# Patient Record
Sex: Female | Born: 1945
Health system: Southern US, Community
[De-identification: ages and names within clinical notes are randomized; demographics above are authoritative.]

## PROBLEM LIST (undated history)

## (undated) DIAGNOSIS — F0150 Vascular dementia without behavioral disturbance: Secondary | ICD-10-CM

## (undated) DIAGNOSIS — F32A Depression, unspecified: Secondary | ICD-10-CM

## (undated) DIAGNOSIS — R5383 Other fatigue: Secondary | ICD-10-CM

## (undated) DIAGNOSIS — E669 Obesity, unspecified: Secondary | ICD-10-CM

## (undated) DIAGNOSIS — E785 Hyperlipidemia, unspecified: Secondary | ICD-10-CM

## (undated) DIAGNOSIS — I679 Cerebrovascular disease, unspecified: Secondary | ICD-10-CM

## (undated) DIAGNOSIS — Z6832 Body mass index (BMI) 32.0-32.9, adult: Secondary | ICD-10-CM

## (undated) DIAGNOSIS — Z13820 Encounter for screening for osteoporosis: Secondary | ICD-10-CM

## (undated) DIAGNOSIS — F0153 Vascular dementia, unspecified severity, with mood disturbance: Secondary | ICD-10-CM

## (undated) DIAGNOSIS — R5381 Other malaise: Secondary | ICD-10-CM

## (undated) DIAGNOSIS — I10 Essential (primary) hypertension: Secondary | ICD-10-CM

## (undated) DIAGNOSIS — G4700 Insomnia, unspecified: Secondary | ICD-10-CM

## (undated) DIAGNOSIS — E871 Hypo-osmolality and hyponatremia: Secondary | ICD-10-CM

## (undated) DIAGNOSIS — R569 Unspecified convulsions: Secondary | ICD-10-CM

## (undated) DIAGNOSIS — R7309 Other abnormal glucose: Secondary | ICD-10-CM

## (undated) HISTORY — DX: Vascular dementia without behavioral disturbance: F01.50

## (undated) HISTORY — DX: Depression, unspecified: F32.A

## (undated) HISTORY — DX: Cerebrovascular disease, unspecified: I67.9

## (undated) HISTORY — DX: Other abnormal glucose: R73.09

## (undated) HISTORY — DX: Hypo-osmolality and hyponatremia: E87.1

## (undated) HISTORY — DX: Vascular dementia, unspecified severity, with mood disturbance: F01.53

## (undated) HISTORY — DX: Other fatigue: R53.83

## (undated) HISTORY — DX: Unspecified convulsions: R56.9

## (undated) HISTORY — DX: Hyperlipidemia, unspecified: E78.5

## (undated) HISTORY — DX: Other malaise: R53.81

## (undated) HISTORY — DX: Obesity, unspecified: E66.9

## (undated) HISTORY — DX: Encounter for screening for osteoporosis: Z13.820

## (undated) HISTORY — DX: Body mass index (BMI) 32.0-32.9, adult: Z68.32

## (undated) HISTORY — PX: CATARACT EXTRACTION: SUR2

## (undated) HISTORY — DX: Essential (primary) hypertension: I10

## (undated) HISTORY — DX: Insomnia, unspecified: G47.00

---

## 1995-04-04 HISTORY — PX: ABDOMINAL HYSTERECTOMY: SHX81

## 2001-04-03 HISTORY — PX: GALLBLADDER SURGERY: SHX652

## 2012-04-03 HISTORY — PX: APPENDECTOMY: SHX54

## 2012-05-30 DIAGNOSIS — I10 Essential (primary) hypertension: Secondary | ICD-10-CM | POA: Diagnosis not present

## 2012-05-30 DIAGNOSIS — Z0000 Encounter for general adult medical examination without abnormal findings: Secondary | ICD-10-CM | POA: Diagnosis not present

## 2012-05-30 DIAGNOSIS — Z6834 Body mass index (BMI) 34.0-34.9, adult: Secondary | ICD-10-CM | POA: Diagnosis not present

## 2012-05-30 DIAGNOSIS — R5383 Other fatigue: Secondary | ICD-10-CM | POA: Diagnosis not present

## 2012-05-30 DIAGNOSIS — Z9181 History of falling: Secondary | ICD-10-CM | POA: Diagnosis not present

## 2012-05-30 DIAGNOSIS — Z23 Encounter for immunization: Secondary | ICD-10-CM | POA: Diagnosis not present

## 2012-05-30 DIAGNOSIS — E785 Hyperlipidemia, unspecified: Secondary | ICD-10-CM | POA: Diagnosis not present

## 2012-05-30 DIAGNOSIS — Z1331 Encounter for screening for depression: Secondary | ICD-10-CM | POA: Diagnosis not present

## 2012-06-21 DIAGNOSIS — Z13820 Encounter for screening for osteoporosis: Secondary | ICD-10-CM | POA: Diagnosis not present

## 2012-06-21 DIAGNOSIS — Z1231 Encounter for screening mammogram for malignant neoplasm of breast: Secondary | ICD-10-CM | POA: Diagnosis not present

## 2012-10-19 DIAGNOSIS — N3000 Acute cystitis without hematuria: Secondary | ICD-10-CM | POA: Diagnosis not present

## 2012-11-05 DIAGNOSIS — R1084 Generalized abdominal pain: Secondary | ICD-10-CM | POA: Diagnosis not present

## 2012-11-08 DIAGNOSIS — F3289 Other specified depressive episodes: Secondary | ICD-10-CM | POA: Diagnosis not present

## 2012-11-08 DIAGNOSIS — K352 Acute appendicitis with generalized peritonitis, without abscess: Secondary | ICD-10-CM | POA: Diagnosis not present

## 2012-11-08 DIAGNOSIS — K3580 Unspecified acute appendicitis: Secondary | ICD-10-CM | POA: Diagnosis not present

## 2012-11-08 DIAGNOSIS — K35209 Acute appendicitis with generalized peritonitis, without abscess, unspecified as to perforation: Secondary | ICD-10-CM | POA: Diagnosis not present

## 2012-11-08 DIAGNOSIS — Z90710 Acquired absence of both cervix and uterus: Secondary | ICD-10-CM | POA: Diagnosis not present

## 2012-11-08 DIAGNOSIS — I10 Essential (primary) hypertension: Secondary | ICD-10-CM | POA: Diagnosis not present

## 2012-11-08 DIAGNOSIS — F329 Major depressive disorder, single episode, unspecified: Secondary | ICD-10-CM | POA: Diagnosis not present

## 2012-11-08 DIAGNOSIS — K219 Gastro-esophageal reflux disease without esophagitis: Secondary | ICD-10-CM | POA: Diagnosis not present

## 2012-11-08 DIAGNOSIS — R110 Nausea: Secondary | ICD-10-CM | POA: Diagnosis not present

## 2012-11-08 DIAGNOSIS — E7800 Pure hypercholesterolemia, unspecified: Secondary | ICD-10-CM | POA: Diagnosis not present

## 2012-11-08 DIAGNOSIS — Z7982 Long term (current) use of aspirin: Secondary | ICD-10-CM | POA: Diagnosis not present

## 2012-11-08 DIAGNOSIS — Z79899 Other long term (current) drug therapy: Secondary | ICD-10-CM | POA: Diagnosis not present

## 2012-11-08 DIAGNOSIS — R1031 Right lower quadrant pain: Secondary | ICD-10-CM | POA: Diagnosis not present

## 2012-11-29 DIAGNOSIS — Z6833 Body mass index (BMI) 33.0-33.9, adult: Secondary | ICD-10-CM | POA: Diagnosis not present

## 2012-11-29 DIAGNOSIS — I10 Essential (primary) hypertension: Secondary | ICD-10-CM | POA: Diagnosis not present

## 2012-11-29 DIAGNOSIS — F329 Major depressive disorder, single episode, unspecified: Secondary | ICD-10-CM | POA: Diagnosis not present

## 2012-11-29 DIAGNOSIS — E785 Hyperlipidemia, unspecified: Secondary | ICD-10-CM | POA: Diagnosis not present

## 2013-01-02 DIAGNOSIS — Z23 Encounter for immunization: Secondary | ICD-10-CM | POA: Diagnosis not present

## 2013-04-30 DIAGNOSIS — H2510 Age-related nuclear cataract, unspecified eye: Secondary | ICD-10-CM | POA: Diagnosis not present

## 2013-06-05 DIAGNOSIS — Z79899 Other long term (current) drug therapy: Secondary | ICD-10-CM | POA: Diagnosis not present

## 2013-06-05 DIAGNOSIS — I10 Essential (primary) hypertension: Secondary | ICD-10-CM | POA: Diagnosis not present

## 2013-06-05 DIAGNOSIS — F329 Major depressive disorder, single episode, unspecified: Secondary | ICD-10-CM | POA: Diagnosis not present

## 2013-06-05 DIAGNOSIS — Z1331 Encounter for screening for depression: Secondary | ICD-10-CM | POA: Diagnosis not present

## 2013-06-05 DIAGNOSIS — F3289 Other specified depressive episodes: Secondary | ICD-10-CM | POA: Diagnosis not present

## 2013-06-05 DIAGNOSIS — Z6833 Body mass index (BMI) 33.0-33.9, adult: Secondary | ICD-10-CM | POA: Diagnosis not present

## 2013-06-05 DIAGNOSIS — E785 Hyperlipidemia, unspecified: Secondary | ICD-10-CM | POA: Diagnosis not present

## 2013-06-05 DIAGNOSIS — Z9181 History of falling: Secondary | ICD-10-CM | POA: Diagnosis not present

## 2013-06-24 DIAGNOSIS — Z1231 Encounter for screening mammogram for malignant neoplasm of breast: Secondary | ICD-10-CM | POA: Diagnosis not present

## 2013-09-05 DIAGNOSIS — Z6834 Body mass index (BMI) 34.0-34.9, adult: Secondary | ICD-10-CM | POA: Diagnosis not present

## 2013-09-05 DIAGNOSIS — J209 Acute bronchitis, unspecified: Secondary | ICD-10-CM | POA: Diagnosis not present

## 2013-11-14 DIAGNOSIS — H40039 Anatomical narrow angle, unspecified eye: Secondary | ICD-10-CM | POA: Diagnosis not present

## 2013-11-14 DIAGNOSIS — H5200 Hypermetropia, unspecified eye: Secondary | ICD-10-CM | POA: Diagnosis not present

## 2013-11-14 DIAGNOSIS — H2510 Age-related nuclear cataract, unspecified eye: Secondary | ICD-10-CM | POA: Diagnosis not present

## 2013-12-09 DIAGNOSIS — I10 Essential (primary) hypertension: Secondary | ICD-10-CM | POA: Diagnosis not present

## 2013-12-09 DIAGNOSIS — E785 Hyperlipidemia, unspecified: Secondary | ICD-10-CM | POA: Diagnosis not present

## 2013-12-09 DIAGNOSIS — F3289 Other specified depressive episodes: Secondary | ICD-10-CM | POA: Diagnosis not present

## 2013-12-09 DIAGNOSIS — F329 Major depressive disorder, single episode, unspecified: Secondary | ICD-10-CM | POA: Diagnosis not present

## 2013-12-09 DIAGNOSIS — R5383 Other fatigue: Secondary | ICD-10-CM | POA: Diagnosis not present

## 2013-12-09 DIAGNOSIS — Z23 Encounter for immunization: Secondary | ICD-10-CM | POA: Diagnosis not present

## 2013-12-09 DIAGNOSIS — R5381 Other malaise: Secondary | ICD-10-CM | POA: Diagnosis not present

## 2014-01-27 DIAGNOSIS — Z23 Encounter for immunization: Secondary | ICD-10-CM | POA: Diagnosis not present

## 2014-01-27 DIAGNOSIS — R5381 Other malaise: Secondary | ICD-10-CM | POA: Diagnosis not present

## 2014-01-27 DIAGNOSIS — Z6835 Body mass index (BMI) 35.0-35.9, adult: Secondary | ICD-10-CM | POA: Diagnosis not present

## 2014-02-24 DIAGNOSIS — N390 Urinary tract infection, site not specified: Secondary | ICD-10-CM | POA: Diagnosis not present

## 2014-02-24 DIAGNOSIS — R5381 Other malaise: Secondary | ICD-10-CM | POA: Diagnosis not present

## 2014-02-24 DIAGNOSIS — Z6835 Body mass index (BMI) 35.0-35.9, adult: Secondary | ICD-10-CM | POA: Diagnosis not present

## 2014-03-26 DIAGNOSIS — Z6835 Body mass index (BMI) 35.0-35.9, adult: Secondary | ICD-10-CM | POA: Diagnosis not present

## 2014-03-26 DIAGNOSIS — R300 Dysuria: Secondary | ICD-10-CM | POA: Diagnosis not present

## 2014-03-26 DIAGNOSIS — F329 Major depressive disorder, single episode, unspecified: Secondary | ICD-10-CM | POA: Diagnosis not present

## 2014-03-26 DIAGNOSIS — R5381 Other malaise: Secondary | ICD-10-CM | POA: Diagnosis not present

## 2014-06-01 DIAGNOSIS — Z6835 Body mass index (BMI) 35.0-35.9, adult: Secondary | ICD-10-CM | POA: Diagnosis not present

## 2014-06-01 DIAGNOSIS — Z1212 Encounter for screening for malignant neoplasm of rectum: Secondary | ICD-10-CM | POA: Diagnosis not present

## 2014-06-01 DIAGNOSIS — I10 Essential (primary) hypertension: Secondary | ICD-10-CM | POA: Diagnosis not present

## 2014-06-01 DIAGNOSIS — F329 Major depressive disorder, single episode, unspecified: Secondary | ICD-10-CM | POA: Diagnosis not present

## 2014-06-01 DIAGNOSIS — R5381 Other malaise: Secondary | ICD-10-CM | POA: Diagnosis not present

## 2014-06-01 DIAGNOSIS — E785 Hyperlipidemia, unspecified: Secondary | ICD-10-CM | POA: Diagnosis not present

## 2014-06-01 DIAGNOSIS — Z0000 Encounter for general adult medical examination without abnormal findings: Secondary | ICD-10-CM | POA: Diagnosis not present

## 2014-06-30 DIAGNOSIS — Z1231 Encounter for screening mammogram for malignant neoplasm of breast: Secondary | ICD-10-CM | POA: Diagnosis not present

## 2014-08-02 HISTORY — PX: OTHER SURGICAL HISTORY: SHX169

## 2014-08-07 DIAGNOSIS — H2513 Age-related nuclear cataract, bilateral: Secondary | ICD-10-CM | POA: Diagnosis not present

## 2014-08-07 DIAGNOSIS — H52203 Unspecified astigmatism, bilateral: Secondary | ICD-10-CM | POA: Diagnosis not present

## 2014-08-07 DIAGNOSIS — H1789 Other corneal scars and opacities: Secondary | ICD-10-CM | POA: Diagnosis not present

## 2014-08-24 DIAGNOSIS — H1789 Other corneal scars and opacities: Secondary | ICD-10-CM | POA: Diagnosis not present

## 2014-09-01 DIAGNOSIS — Z1389 Encounter for screening for other disorder: Secondary | ICD-10-CM | POA: Diagnosis not present

## 2014-09-01 DIAGNOSIS — E785 Hyperlipidemia, unspecified: Secondary | ICD-10-CM | POA: Diagnosis not present

## 2014-09-01 DIAGNOSIS — Z9181 History of falling: Secondary | ICD-10-CM | POA: Diagnosis not present

## 2014-09-01 DIAGNOSIS — F329 Major depressive disorder, single episode, unspecified: Secondary | ICD-10-CM | POA: Diagnosis not present

## 2014-09-01 DIAGNOSIS — I10 Essential (primary) hypertension: Secondary | ICD-10-CM | POA: Diagnosis not present

## 2014-09-01 DIAGNOSIS — Z6834 Body mass index (BMI) 34.0-34.9, adult: Secondary | ICD-10-CM | POA: Diagnosis not present

## 2014-09-21 DIAGNOSIS — Z6835 Body mass index (BMI) 35.0-35.9, adult: Secondary | ICD-10-CM | POA: Diagnosis not present

## 2014-09-21 DIAGNOSIS — F329 Major depressive disorder, single episode, unspecified: Secondary | ICD-10-CM | POA: Diagnosis not present

## 2014-09-21 DIAGNOSIS — Z9181 History of falling: Secondary | ICD-10-CM | POA: Diagnosis not present

## 2014-09-21 DIAGNOSIS — R5381 Other malaise: Secondary | ICD-10-CM | POA: Diagnosis not present

## 2014-09-21 DIAGNOSIS — Z1389 Encounter for screening for other disorder: Secondary | ICD-10-CM | POA: Diagnosis not present

## 2014-10-21 DIAGNOSIS — Z6835 Body mass index (BMI) 35.0-35.9, adult: Secondary | ICD-10-CM | POA: Diagnosis not present

## 2014-10-21 DIAGNOSIS — I10 Essential (primary) hypertension: Secondary | ICD-10-CM | POA: Diagnosis not present

## 2014-10-21 DIAGNOSIS — R5381 Other malaise: Secondary | ICD-10-CM | POA: Diagnosis not present

## 2014-10-21 DIAGNOSIS — F329 Major depressive disorder, single episode, unspecified: Secondary | ICD-10-CM | POA: Diagnosis not present

## 2014-10-29 DIAGNOSIS — H2511 Age-related nuclear cataract, right eye: Secondary | ICD-10-CM | POA: Diagnosis not present

## 2014-10-29 DIAGNOSIS — H25811 Combined forms of age-related cataract, right eye: Secondary | ICD-10-CM | POA: Diagnosis not present

## 2014-10-29 HISTORY — PX: CATARACT EXTRACTION: SUR2

## 2014-12-03 DIAGNOSIS — Z6834 Body mass index (BMI) 34.0-34.9, adult: Secondary | ICD-10-CM | POA: Diagnosis not present

## 2014-12-03 DIAGNOSIS — I10 Essential (primary) hypertension: Secondary | ICD-10-CM | POA: Diagnosis not present

## 2014-12-03 DIAGNOSIS — R5381 Other malaise: Secondary | ICD-10-CM | POA: Diagnosis not present

## 2014-12-03 DIAGNOSIS — F329 Major depressive disorder, single episode, unspecified: Secondary | ICD-10-CM | POA: Diagnosis not present

## 2014-12-03 DIAGNOSIS — E785 Hyperlipidemia, unspecified: Secondary | ICD-10-CM | POA: Diagnosis not present

## 2014-12-03 DIAGNOSIS — R7309 Other abnormal glucose: Secondary | ICD-10-CM | POA: Diagnosis not present

## 2015-03-11 DIAGNOSIS — Z23 Encounter for immunization: Secondary | ICD-10-CM | POA: Diagnosis not present

## 2015-03-11 DIAGNOSIS — E785 Hyperlipidemia, unspecified: Secondary | ICD-10-CM | POA: Diagnosis not present

## 2015-03-11 DIAGNOSIS — Z6834 Body mass index (BMI) 34.0-34.9, adult: Secondary | ICD-10-CM | POA: Diagnosis not present

## 2015-03-11 DIAGNOSIS — Z1389 Encounter for screening for other disorder: Secondary | ICD-10-CM | POA: Diagnosis not present

## 2015-03-11 DIAGNOSIS — F329 Major depressive disorder, single episode, unspecified: Secondary | ICD-10-CM | POA: Diagnosis not present

## 2015-03-11 DIAGNOSIS — I10 Essential (primary) hypertension: Secondary | ICD-10-CM | POA: Diagnosis not present

## 2015-04-07 DIAGNOSIS — E538 Deficiency of other specified B group vitamins: Secondary | ICD-10-CM | POA: Diagnosis not present

## 2015-04-07 DIAGNOSIS — E559 Vitamin D deficiency, unspecified: Secondary | ICD-10-CM | POA: Diagnosis not present

## 2015-06-15 DIAGNOSIS — Z6834 Body mass index (BMI) 34.0-34.9, adult: Secondary | ICD-10-CM | POA: Diagnosis not present

## 2015-06-15 DIAGNOSIS — E785 Hyperlipidemia, unspecified: Secondary | ICD-10-CM | POA: Diagnosis not present

## 2015-06-15 DIAGNOSIS — R5381 Other malaise: Secondary | ICD-10-CM | POA: Diagnosis not present

## 2015-06-15 DIAGNOSIS — F329 Major depressive disorder, single episode, unspecified: Secondary | ICD-10-CM | POA: Diagnosis not present

## 2015-06-15 DIAGNOSIS — Z1212 Encounter for screening for malignant neoplasm of rectum: Secondary | ICD-10-CM | POA: Diagnosis not present

## 2015-06-15 DIAGNOSIS — E669 Obesity, unspecified: Secondary | ICD-10-CM | POA: Diagnosis not present

## 2015-06-15 DIAGNOSIS — I10 Essential (primary) hypertension: Secondary | ICD-10-CM | POA: Diagnosis not present

## 2015-06-15 DIAGNOSIS — Z0000 Encounter for general adult medical examination without abnormal findings: Secondary | ICD-10-CM | POA: Diagnosis not present

## 2015-06-15 DIAGNOSIS — Z1231 Encounter for screening mammogram for malignant neoplasm of breast: Secondary | ICD-10-CM | POA: Diagnosis not present

## 2015-07-01 DIAGNOSIS — Z1231 Encounter for screening mammogram for malignant neoplasm of breast: Secondary | ICD-10-CM | POA: Diagnosis not present

## 2015-08-10 DIAGNOSIS — H1789 Other corneal scars and opacities: Secondary | ICD-10-CM | POA: Diagnosis not present

## 2015-08-10 DIAGNOSIS — H52203 Unspecified astigmatism, bilateral: Secondary | ICD-10-CM | POA: Diagnosis not present

## 2015-12-17 DIAGNOSIS — Z23 Encounter for immunization: Secondary | ICD-10-CM | POA: Diagnosis not present

## 2015-12-20 DIAGNOSIS — E669 Obesity, unspecified: Secondary | ICD-10-CM | POA: Diagnosis not present

## 2015-12-20 DIAGNOSIS — E785 Hyperlipidemia, unspecified: Secondary | ICD-10-CM | POA: Diagnosis not present

## 2015-12-20 DIAGNOSIS — F329 Major depressive disorder, single episode, unspecified: Secondary | ICD-10-CM | POA: Diagnosis not present

## 2015-12-20 DIAGNOSIS — H938X1 Other specified disorders of right ear: Secondary | ICD-10-CM | POA: Diagnosis not present

## 2015-12-20 DIAGNOSIS — I10 Essential (primary) hypertension: Secondary | ICD-10-CM | POA: Diagnosis not present

## 2015-12-20 DIAGNOSIS — Z6829 Body mass index (BMI) 29.0-29.9, adult: Secondary | ICD-10-CM | POA: Diagnosis not present

## 2015-12-20 DIAGNOSIS — R7309 Other abnormal glucose: Secondary | ICD-10-CM | POA: Diagnosis not present

## 2016-01-12 DIAGNOSIS — J029 Acute pharyngitis, unspecified: Secondary | ICD-10-CM | POA: Diagnosis not present

## 2016-01-12 DIAGNOSIS — I10 Essential (primary) hypertension: Secondary | ICD-10-CM | POA: Diagnosis not present

## 2016-05-08 DIAGNOSIS — J020 Streptococcal pharyngitis: Secondary | ICD-10-CM | POA: Diagnosis not present

## 2016-06-15 DIAGNOSIS — E785 Hyperlipidemia, unspecified: Secondary | ICD-10-CM | POA: Diagnosis not present

## 2016-06-15 DIAGNOSIS — Z9181 History of falling: Secondary | ICD-10-CM | POA: Diagnosis not present

## 2016-06-15 DIAGNOSIS — Z1389 Encounter for screening for other disorder: Secondary | ICD-10-CM | POA: Diagnosis not present

## 2016-06-15 DIAGNOSIS — Z0000 Encounter for general adult medical examination without abnormal findings: Secondary | ICD-10-CM | POA: Diagnosis not present

## 2016-06-15 DIAGNOSIS — Z136 Encounter for screening for cardiovascular disorders: Secondary | ICD-10-CM | POA: Diagnosis not present

## 2016-06-15 DIAGNOSIS — Z1231 Encounter for screening mammogram for malignant neoplasm of breast: Secondary | ICD-10-CM | POA: Diagnosis not present

## 2016-06-20 DIAGNOSIS — R7309 Other abnormal glucose: Secondary | ICD-10-CM | POA: Diagnosis not present

## 2016-06-20 DIAGNOSIS — E785 Hyperlipidemia, unspecified: Secondary | ICD-10-CM | POA: Diagnosis not present

## 2016-06-20 DIAGNOSIS — I10 Essential (primary) hypertension: Secondary | ICD-10-CM | POA: Diagnosis not present

## 2016-07-03 DIAGNOSIS — Z1231 Encounter for screening mammogram for malignant neoplasm of breast: Secondary | ICD-10-CM | POA: Diagnosis not present

## 2016-08-15 DIAGNOSIS — H52203 Unspecified astigmatism, bilateral: Secondary | ICD-10-CM | POA: Diagnosis not present

## 2016-08-15 DIAGNOSIS — H2512 Age-related nuclear cataract, left eye: Secondary | ICD-10-CM | POA: Diagnosis not present

## 2016-12-06 DIAGNOSIS — Z87440 Personal history of urinary (tract) infections: Secondary | ICD-10-CM | POA: Diagnosis not present

## 2016-12-06 DIAGNOSIS — E222 Syndrome of inappropriate secretion of antidiuretic hormone: Secondary | ICD-10-CM | POA: Diagnosis present

## 2016-12-06 DIAGNOSIS — N3000 Acute cystitis without hematuria: Secondary | ICD-10-CM | POA: Diagnosis not present

## 2016-12-06 DIAGNOSIS — M1990 Unspecified osteoarthritis, unspecified site: Secondary | ICD-10-CM | POA: Diagnosis present

## 2016-12-06 DIAGNOSIS — R05 Cough: Secondary | ICD-10-CM | POA: Diagnosis not present

## 2016-12-06 DIAGNOSIS — Z885 Allergy status to narcotic agent status: Secondary | ICD-10-CM | POA: Diagnosis not present

## 2016-12-06 DIAGNOSIS — E041 Nontoxic single thyroid nodule: Secondary | ICD-10-CM | POA: Diagnosis present

## 2016-12-06 DIAGNOSIS — R3129 Other microscopic hematuria: Secondary | ICD-10-CM | POA: Diagnosis not present

## 2016-12-06 DIAGNOSIS — E7800 Pure hypercholesterolemia, unspecified: Secondary | ICD-10-CM | POA: Diagnosis present

## 2016-12-06 DIAGNOSIS — R569 Unspecified convulsions: Secondary | ICD-10-CM | POA: Diagnosis not present

## 2016-12-06 DIAGNOSIS — Z79899 Other long term (current) drug therapy: Secondary | ICD-10-CM | POA: Diagnosis not present

## 2016-12-06 DIAGNOSIS — R930 Abnormal findings on diagnostic imaging of skull and head, not elsewhere classified: Secondary | ICD-10-CM | POA: Diagnosis not present

## 2016-12-06 DIAGNOSIS — I63233 Cerebral infarction due to unspecified occlusion or stenosis of bilateral carotid arteries: Secondary | ICD-10-CM | POA: Diagnosis not present

## 2016-12-06 DIAGNOSIS — Z23 Encounter for immunization: Secondary | ICD-10-CM | POA: Diagnosis not present

## 2016-12-06 DIAGNOSIS — N3001 Acute cystitis with hematuria: Secondary | ICD-10-CM | POA: Diagnosis not present

## 2016-12-06 DIAGNOSIS — F418 Other specified anxiety disorders: Secondary | ICD-10-CM | POA: Diagnosis present

## 2016-12-06 DIAGNOSIS — K219 Gastro-esophageal reflux disease without esophagitis: Secondary | ICD-10-CM | POA: Diagnosis present

## 2016-12-06 DIAGNOSIS — Z7982 Long term (current) use of aspirin: Secondary | ICD-10-CM | POA: Diagnosis not present

## 2016-12-06 DIAGNOSIS — Z87891 Personal history of nicotine dependence: Secondary | ICD-10-CM | POA: Diagnosis not present

## 2016-12-06 DIAGNOSIS — E871 Hypo-osmolality and hyponatremia: Secondary | ICD-10-CM | POA: Diagnosis not present

## 2016-12-06 DIAGNOSIS — I10 Essential (primary) hypertension: Secondary | ICD-10-CM | POA: Diagnosis present

## 2016-12-06 DIAGNOSIS — G4089 Other seizures: Secondary | ICD-10-CM | POA: Diagnosis not present

## 2016-12-06 DIAGNOSIS — I6789 Other cerebrovascular disease: Secondary | ICD-10-CM | POA: Diagnosis not present

## 2016-12-07 DIAGNOSIS — I6789 Other cerebrovascular disease: Secondary | ICD-10-CM

## 2016-12-12 DIAGNOSIS — R569 Unspecified convulsions: Secondary | ICD-10-CM | POA: Diagnosis not present

## 2016-12-12 DIAGNOSIS — E041 Nontoxic single thyroid nodule: Secondary | ICD-10-CM | POA: Diagnosis not present

## 2016-12-12 DIAGNOSIS — E871 Hypo-osmolality and hyponatremia: Secondary | ICD-10-CM | POA: Diagnosis not present

## 2016-12-12 DIAGNOSIS — N390 Urinary tract infection, site not specified: Secondary | ICD-10-CM | POA: Diagnosis not present

## 2016-12-12 DIAGNOSIS — R5382 Chronic fatigue, unspecified: Secondary | ICD-10-CM | POA: Diagnosis not present

## 2016-12-12 DIAGNOSIS — I639 Cerebral infarction, unspecified: Secondary | ICD-10-CM | POA: Diagnosis not present

## 2016-12-12 DIAGNOSIS — I6529 Occlusion and stenosis of unspecified carotid artery: Secondary | ICD-10-CM | POA: Diagnosis not present

## 2016-12-12 DIAGNOSIS — Z6828 Body mass index (BMI) 28.0-28.9, adult: Secondary | ICD-10-CM | POA: Diagnosis not present

## 2016-12-12 DIAGNOSIS — I10 Essential (primary) hypertension: Secondary | ICD-10-CM | POA: Diagnosis not present

## 2016-12-12 DIAGNOSIS — Z79899 Other long term (current) drug therapy: Secondary | ICD-10-CM | POA: Diagnosis not present

## 2016-12-12 DIAGNOSIS — F329 Major depressive disorder, single episode, unspecified: Secondary | ICD-10-CM | POA: Diagnosis not present

## 2016-12-19 DIAGNOSIS — E871 Hypo-osmolality and hyponatremia: Secondary | ICD-10-CM | POA: Diagnosis not present

## 2016-12-19 DIAGNOSIS — I6529 Occlusion and stenosis of unspecified carotid artery: Secondary | ICD-10-CM | POA: Diagnosis not present

## 2016-12-19 DIAGNOSIS — E041 Nontoxic single thyroid nodule: Secondary | ICD-10-CM | POA: Diagnosis not present

## 2016-12-19 DIAGNOSIS — I639 Cerebral infarction, unspecified: Secondary | ICD-10-CM | POA: Diagnosis not present

## 2017-01-02 DIAGNOSIS — R300 Dysuria: Secondary | ICD-10-CM | POA: Diagnosis not present

## 2017-01-02 DIAGNOSIS — I10 Essential (primary) hypertension: Secondary | ICD-10-CM | POA: Diagnosis not present

## 2017-01-02 DIAGNOSIS — Z23 Encounter for immunization: Secondary | ICD-10-CM | POA: Diagnosis not present

## 2017-01-02 DIAGNOSIS — I639 Cerebral infarction, unspecified: Secondary | ICD-10-CM | POA: Diagnosis not present

## 2017-01-02 DIAGNOSIS — Z9181 History of falling: Secondary | ICD-10-CM | POA: Diagnosis not present

## 2017-01-03 DIAGNOSIS — E041 Nontoxic single thyroid nodule: Secondary | ICD-10-CM | POA: Diagnosis not present

## 2017-01-05 DIAGNOSIS — I639 Cerebral infarction, unspecified: Secondary | ICD-10-CM | POA: Diagnosis not present

## 2017-01-05 DIAGNOSIS — I6523 Occlusion and stenosis of bilateral carotid arteries: Secondary | ICD-10-CM | POA: Diagnosis not present

## 2017-01-05 DIAGNOSIS — R569 Unspecified convulsions: Secondary | ICD-10-CM | POA: Diagnosis not present

## 2017-01-05 DIAGNOSIS — I6529 Occlusion and stenosis of unspecified carotid artery: Secondary | ICD-10-CM | POA: Diagnosis not present

## 2017-01-16 DIAGNOSIS — E871 Hypo-osmolality and hyponatremia: Secondary | ICD-10-CM | POA: Diagnosis not present

## 2017-01-16 DIAGNOSIS — L299 Pruritus, unspecified: Secondary | ICD-10-CM | POA: Diagnosis not present

## 2017-01-16 DIAGNOSIS — R569 Unspecified convulsions: Secondary | ICD-10-CM | POA: Diagnosis not present

## 2017-01-16 DIAGNOSIS — E785 Hyperlipidemia, unspecified: Secondary | ICD-10-CM | POA: Diagnosis not present

## 2017-01-16 DIAGNOSIS — I679 Cerebrovascular disease, unspecified: Secondary | ICD-10-CM | POA: Diagnosis not present

## 2017-01-16 DIAGNOSIS — I6529 Occlusion and stenosis of unspecified carotid artery: Secondary | ICD-10-CM | POA: Diagnosis not present

## 2017-01-16 DIAGNOSIS — I10 Essential (primary) hypertension: Secondary | ICD-10-CM | POA: Diagnosis not present

## 2017-01-25 DIAGNOSIS — R569 Unspecified convulsions: Secondary | ICD-10-CM | POA: Diagnosis not present

## 2017-01-25 DIAGNOSIS — Z719 Counseling, unspecified: Secondary | ICD-10-CM | POA: Diagnosis not present

## 2017-01-31 DIAGNOSIS — R569 Unspecified convulsions: Secondary | ICD-10-CM | POA: Diagnosis not present

## 2017-02-20 DIAGNOSIS — I679 Cerebrovascular disease, unspecified: Secondary | ICD-10-CM | POA: Diagnosis not present

## 2017-02-20 DIAGNOSIS — E871 Hypo-osmolality and hyponatremia: Secondary | ICD-10-CM | POA: Diagnosis not present

## 2017-02-20 DIAGNOSIS — I10 Essential (primary) hypertension: Secondary | ICD-10-CM | POA: Diagnosis not present

## 2017-03-21 DIAGNOSIS — R413 Other amnesia: Secondary | ICD-10-CM | POA: Diagnosis not present

## 2017-03-21 DIAGNOSIS — R569 Unspecified convulsions: Secondary | ICD-10-CM | POA: Diagnosis not present

## 2017-04-05 DIAGNOSIS — E871 Hypo-osmolality and hyponatremia: Secondary | ICD-10-CM | POA: Diagnosis not present

## 2017-04-05 DIAGNOSIS — R569 Unspecified convulsions: Secondary | ICD-10-CM | POA: Diagnosis not present

## 2017-04-05 DIAGNOSIS — I10 Essential (primary) hypertension: Secondary | ICD-10-CM | POA: Diagnosis not present

## 2017-04-18 DIAGNOSIS — R413 Other amnesia: Secondary | ICD-10-CM | POA: Diagnosis not present

## 2017-04-23 DIAGNOSIS — R569 Unspecified convulsions: Secondary | ICD-10-CM | POA: Diagnosis not present

## 2017-05-02 DIAGNOSIS — R413 Other amnesia: Secondary | ICD-10-CM | POA: Diagnosis not present

## 2017-05-02 DIAGNOSIS — R569 Unspecified convulsions: Secondary | ICD-10-CM | POA: Diagnosis not present

## 2017-05-18 DIAGNOSIS — F329 Major depressive disorder, single episode, unspecified: Secondary | ICD-10-CM | POA: Diagnosis not present

## 2017-05-18 DIAGNOSIS — I10 Essential (primary) hypertension: Secondary | ICD-10-CM | POA: Diagnosis not present

## 2017-05-18 DIAGNOSIS — E785 Hyperlipidemia, unspecified: Secondary | ICD-10-CM | POA: Diagnosis not present

## 2017-05-18 DIAGNOSIS — Z6830 Body mass index (BMI) 30.0-30.9, adult: Secondary | ICD-10-CM | POA: Diagnosis not present

## 2017-05-18 DIAGNOSIS — I679 Cerebrovascular disease, unspecified: Secondary | ICD-10-CM | POA: Diagnosis not present

## 2017-05-18 DIAGNOSIS — Z79899 Other long term (current) drug therapy: Secondary | ICD-10-CM | POA: Diagnosis not present

## 2017-05-28 DIAGNOSIS — R413 Other amnesia: Secondary | ICD-10-CM | POA: Diagnosis not present

## 2017-06-11 DIAGNOSIS — G3184 Mild cognitive impairment, so stated: Secondary | ICD-10-CM | POA: Diagnosis not present

## 2017-06-11 DIAGNOSIS — I69914 Frontal lobe and executive function deficit following unspecified cerebrovascular disease: Secondary | ICD-10-CM | POA: Diagnosis not present

## 2017-06-11 DIAGNOSIS — R413 Other amnesia: Secondary | ICD-10-CM | POA: Diagnosis not present

## 2017-06-11 DIAGNOSIS — F4323 Adjustment disorder with mixed anxiety and depressed mood: Secondary | ICD-10-CM | POA: Diagnosis not present

## 2017-06-14 DIAGNOSIS — E785 Hyperlipidemia, unspecified: Secondary | ICD-10-CM | POA: Diagnosis not present

## 2017-06-14 DIAGNOSIS — Z136 Encounter for screening for cardiovascular disorders: Secondary | ICD-10-CM | POA: Diagnosis not present

## 2017-06-14 DIAGNOSIS — Z1231 Encounter for screening mammogram for malignant neoplasm of breast: Secondary | ICD-10-CM | POA: Diagnosis not present

## 2017-06-14 DIAGNOSIS — Z6831 Body mass index (BMI) 31.0-31.9, adult: Secondary | ICD-10-CM | POA: Diagnosis not present

## 2017-06-14 DIAGNOSIS — Z0000 Encounter for general adult medical examination without abnormal findings: Secondary | ICD-10-CM | POA: Diagnosis not present

## 2017-06-14 DIAGNOSIS — N959 Unspecified menopausal and perimenopausal disorder: Secondary | ICD-10-CM | POA: Diagnosis not present

## 2017-06-14 DIAGNOSIS — E669 Obesity, unspecified: Secondary | ICD-10-CM | POA: Diagnosis not present

## 2017-06-14 DIAGNOSIS — Z1331 Encounter for screening for depression: Secondary | ICD-10-CM | POA: Diagnosis not present

## 2017-06-14 DIAGNOSIS — Z9181 History of falling: Secondary | ICD-10-CM | POA: Diagnosis not present

## 2017-06-25 DIAGNOSIS — R413 Other amnesia: Secondary | ICD-10-CM | POA: Diagnosis not present

## 2017-07-16 DIAGNOSIS — Z1231 Encounter for screening mammogram for malignant neoplasm of breast: Secondary | ICD-10-CM | POA: Diagnosis not present

## 2017-07-16 DIAGNOSIS — Z13820 Encounter for screening for osteoporosis: Secondary | ICD-10-CM | POA: Diagnosis not present

## 2017-07-16 DIAGNOSIS — N959 Unspecified menopausal and perimenopausal disorder: Secondary | ICD-10-CM | POA: Diagnosis not present

## 2017-08-17 DIAGNOSIS — I679 Cerebrovascular disease, unspecified: Secondary | ICD-10-CM | POA: Diagnosis not present

## 2017-08-17 DIAGNOSIS — R569 Unspecified convulsions: Secondary | ICD-10-CM | POA: Diagnosis not present

## 2017-08-17 DIAGNOSIS — E785 Hyperlipidemia, unspecified: Secondary | ICD-10-CM | POA: Diagnosis not present

## 2017-08-17 DIAGNOSIS — I10 Essential (primary) hypertension: Secondary | ICD-10-CM | POA: Diagnosis not present

## 2017-08-22 DIAGNOSIS — H524 Presbyopia: Secondary | ICD-10-CM | POA: Diagnosis not present

## 2017-08-22 DIAGNOSIS — H1789 Other corneal scars and opacities: Secondary | ICD-10-CM | POA: Diagnosis not present

## 2017-09-27 DIAGNOSIS — H25812 Combined forms of age-related cataract, left eye: Secondary | ICD-10-CM | POA: Diagnosis not present

## 2017-09-27 DIAGNOSIS — H2512 Age-related nuclear cataract, left eye: Secondary | ICD-10-CM | POA: Diagnosis not present

## 2017-11-23 DIAGNOSIS — R569 Unspecified convulsions: Secondary | ICD-10-CM | POA: Diagnosis not present

## 2017-11-23 DIAGNOSIS — E871 Hypo-osmolality and hyponatremia: Secondary | ICD-10-CM | POA: Diagnosis not present

## 2017-11-23 DIAGNOSIS — I10 Essential (primary) hypertension: Secondary | ICD-10-CM | POA: Diagnosis not present

## 2017-11-23 DIAGNOSIS — E785 Hyperlipidemia, unspecified: Secondary | ICD-10-CM | POA: Diagnosis not present

## 2018-01-23 DIAGNOSIS — Z23 Encounter for immunization: Secondary | ICD-10-CM | POA: Diagnosis not present

## 2018-02-26 DIAGNOSIS — E785 Hyperlipidemia, unspecified: Secondary | ICD-10-CM | POA: Diagnosis not present

## 2018-02-26 DIAGNOSIS — E871 Hypo-osmolality and hyponatremia: Secondary | ICD-10-CM | POA: Diagnosis not present

## 2018-02-26 DIAGNOSIS — R569 Unspecified convulsions: Secondary | ICD-10-CM | POA: Diagnosis not present

## 2018-02-26 DIAGNOSIS — R7309 Other abnormal glucose: Secondary | ICD-10-CM | POA: Diagnosis not present

## 2018-02-26 DIAGNOSIS — I10 Essential (primary) hypertension: Secondary | ICD-10-CM | POA: Diagnosis not present

## 2018-05-31 DIAGNOSIS — R569 Unspecified convulsions: Secondary | ICD-10-CM | POA: Diagnosis not present

## 2018-05-31 DIAGNOSIS — R7309 Other abnormal glucose: Secondary | ICD-10-CM | POA: Diagnosis not present

## 2018-05-31 DIAGNOSIS — I10 Essential (primary) hypertension: Secondary | ICD-10-CM | POA: Diagnosis not present

## 2018-05-31 DIAGNOSIS — E871 Hypo-osmolality and hyponatremia: Secondary | ICD-10-CM | POA: Diagnosis not present

## 2018-05-31 DIAGNOSIS — E785 Hyperlipidemia, unspecified: Secondary | ICD-10-CM | POA: Diagnosis not present

## 2018-06-05 DIAGNOSIS — R569 Unspecified convulsions: Secondary | ICD-10-CM | POA: Diagnosis not present

## 2018-06-28 DIAGNOSIS — I679 Cerebrovascular disease, unspecified: Secondary | ICD-10-CM | POA: Diagnosis not present

## 2018-06-28 DIAGNOSIS — F0150 Vascular dementia without behavioral disturbance: Secondary | ICD-10-CM | POA: Diagnosis not present

## 2018-06-28 DIAGNOSIS — S098XXS Other specified injuries of head, sequela: Secondary | ICD-10-CM | POA: Diagnosis not present

## 2018-09-02 HISTORY — PX: TOOTH EXTRACTION: SUR596

## 2018-09-03 DIAGNOSIS — I10 Essential (primary) hypertension: Secondary | ICD-10-CM | POA: Diagnosis not present

## 2018-09-03 DIAGNOSIS — R7309 Other abnormal glucose: Secondary | ICD-10-CM | POA: Diagnosis not present

## 2018-09-03 DIAGNOSIS — Z79899 Other long term (current) drug therapy: Secondary | ICD-10-CM | POA: Diagnosis not present

## 2018-09-03 DIAGNOSIS — E785 Hyperlipidemia, unspecified: Secondary | ICD-10-CM | POA: Diagnosis not present

## 2018-09-03 DIAGNOSIS — E871 Hypo-osmolality and hyponatremia: Secondary | ICD-10-CM | POA: Diagnosis not present

## 2018-09-30 DIAGNOSIS — Z1231 Encounter for screening mammogram for malignant neoplasm of breast: Secondary | ICD-10-CM | POA: Diagnosis not present

## 2018-10-07 DIAGNOSIS — Z1331 Encounter for screening for depression: Secondary | ICD-10-CM | POA: Diagnosis not present

## 2018-10-07 DIAGNOSIS — Z0000 Encounter for general adult medical examination without abnormal findings: Secondary | ICD-10-CM | POA: Diagnosis not present

## 2018-10-07 DIAGNOSIS — Z139 Encounter for screening, unspecified: Secondary | ICD-10-CM | POA: Diagnosis not present

## 2018-10-07 DIAGNOSIS — E785 Hyperlipidemia, unspecified: Secondary | ICD-10-CM | POA: Diagnosis not present

## 2018-10-07 DIAGNOSIS — Z9181 History of falling: Secondary | ICD-10-CM | POA: Diagnosis not present

## 2018-10-31 DIAGNOSIS — R569 Unspecified convulsions: Secondary | ICD-10-CM | POA: Diagnosis not present

## 2018-10-31 DIAGNOSIS — R404 Transient alteration of awareness: Secondary | ICD-10-CM | POA: Diagnosis not present

## 2018-10-31 DIAGNOSIS — R410 Disorientation, unspecified: Secondary | ICD-10-CM | POA: Diagnosis not present

## 2018-10-31 DIAGNOSIS — R0902 Hypoxemia: Secondary | ICD-10-CM | POA: Diagnosis not present

## 2018-10-31 DIAGNOSIS — R52 Pain, unspecified: Secondary | ICD-10-CM | POA: Diagnosis not present

## 2018-10-31 DIAGNOSIS — J9811 Atelectasis: Secondary | ICD-10-CM | POA: Diagnosis not present

## 2018-10-31 DIAGNOSIS — R000 Tachycardia, unspecified: Secondary | ICD-10-CM | POA: Diagnosis not present

## 2018-10-31 DIAGNOSIS — R4182 Altered mental status, unspecified: Secondary | ICD-10-CM | POA: Diagnosis not present

## 2018-10-31 DIAGNOSIS — E872 Acidosis: Secondary | ICD-10-CM | POA: Diagnosis not present

## 2018-11-01 DIAGNOSIS — Z885 Allergy status to narcotic agent status: Secondary | ICD-10-CM | POA: Diagnosis not present

## 2018-11-01 DIAGNOSIS — G9341 Metabolic encephalopathy: Secondary | ICD-10-CM | POA: Diagnosis present

## 2018-11-01 DIAGNOSIS — Z79899 Other long term (current) drug therapy: Secondary | ICD-10-CM | POA: Diagnosis not present

## 2018-11-01 DIAGNOSIS — R569 Unspecified convulsions: Secondary | ICD-10-CM | POA: Diagnosis not present

## 2018-11-01 DIAGNOSIS — R410 Disorientation, unspecified: Secondary | ICD-10-CM | POA: Diagnosis not present

## 2018-11-01 DIAGNOSIS — I63233 Cerebral infarction due to unspecified occlusion or stenosis of bilateral carotid arteries: Secondary | ICD-10-CM | POA: Diagnosis not present

## 2018-11-01 DIAGNOSIS — M1990 Unspecified osteoarthritis, unspecified site: Secondary | ICD-10-CM | POA: Diagnosis present

## 2018-11-01 DIAGNOSIS — K219 Gastro-esophageal reflux disease without esophagitis: Secondary | ICD-10-CM | POA: Diagnosis present

## 2018-11-01 DIAGNOSIS — G40909 Epilepsy, unspecified, not intractable, without status epilepticus: Secondary | ICD-10-CM | POA: Diagnosis not present

## 2018-11-01 DIAGNOSIS — E872 Acidosis: Secondary | ICD-10-CM | POA: Diagnosis present

## 2018-11-01 DIAGNOSIS — F329 Major depressive disorder, single episode, unspecified: Secondary | ICD-10-CM | POA: Diagnosis present

## 2018-11-01 DIAGNOSIS — R4182 Altered mental status, unspecified: Secondary | ICD-10-CM | POA: Diagnosis not present

## 2018-11-01 DIAGNOSIS — I10 Essential (primary) hypertension: Secondary | ICD-10-CM | POA: Diagnosis present

## 2018-11-01 DIAGNOSIS — E785 Hyperlipidemia, unspecified: Secondary | ICD-10-CM | POA: Diagnosis present

## 2018-11-01 DIAGNOSIS — Z7902 Long term (current) use of antithrombotics/antiplatelets: Secondary | ICD-10-CM | POA: Diagnosis not present

## 2018-11-01 DIAGNOSIS — J9811 Atelectasis: Secondary | ICD-10-CM | POA: Diagnosis not present

## 2018-11-06 DIAGNOSIS — R0602 Shortness of breath: Secondary | ICD-10-CM | POA: Diagnosis not present

## 2018-11-06 DIAGNOSIS — G40909 Epilepsy, unspecified, not intractable, without status epilepticus: Secondary | ICD-10-CM | POA: Diagnosis not present

## 2018-11-06 DIAGNOSIS — Z7902 Long term (current) use of antithrombotics/antiplatelets: Secondary | ICD-10-CM | POA: Diagnosis not present

## 2018-11-06 DIAGNOSIS — R2681 Unsteadiness on feet: Secondary | ICD-10-CM | POA: Diagnosis not present

## 2018-11-06 DIAGNOSIS — E872 Acidosis: Secondary | ICD-10-CM | POA: Diagnosis not present

## 2018-11-06 DIAGNOSIS — Z885 Allergy status to narcotic agent status: Secondary | ICD-10-CM | POA: Diagnosis not present

## 2018-11-06 DIAGNOSIS — E785 Hyperlipidemia, unspecified: Secondary | ICD-10-CM | POA: Diagnosis not present

## 2018-11-06 DIAGNOSIS — F79 Unspecified intellectual disabilities: Secondary | ICD-10-CM | POA: Diagnosis not present

## 2018-11-06 DIAGNOSIS — F329 Major depressive disorder, single episode, unspecified: Secondary | ICD-10-CM | POA: Diagnosis not present

## 2018-11-06 DIAGNOSIS — K219 Gastro-esophageal reflux disease without esophagitis: Secondary | ICD-10-CM | POA: Diagnosis not present

## 2018-11-06 DIAGNOSIS — F0280 Dementia in other diseases classified elsewhere without behavioral disturbance: Secondary | ICD-10-CM | POA: Diagnosis not present

## 2018-11-06 DIAGNOSIS — G9341 Metabolic encephalopathy: Secondary | ICD-10-CM | POA: Diagnosis not present

## 2018-11-06 DIAGNOSIS — Z79899 Other long term (current) drug therapy: Secondary | ICD-10-CM | POA: Diagnosis not present

## 2018-11-06 DIAGNOSIS — M1990 Unspecified osteoarthritis, unspecified site: Secondary | ICD-10-CM | POA: Diagnosis not present

## 2018-11-06 DIAGNOSIS — I10 Essential (primary) hypertension: Secondary | ICD-10-CM | POA: Diagnosis not present

## 2018-11-06 DIAGNOSIS — G309 Alzheimer's disease, unspecified: Secondary | ICD-10-CM | POA: Diagnosis not present

## 2018-11-11 DIAGNOSIS — F0150 Vascular dementia without behavioral disturbance: Secondary | ICD-10-CM | POA: Diagnosis not present

## 2018-11-11 DIAGNOSIS — F329 Major depressive disorder, single episode, unspecified: Secondary | ICD-10-CM | POA: Diagnosis not present

## 2018-11-11 DIAGNOSIS — R569 Unspecified convulsions: Secondary | ICD-10-CM | POA: Diagnosis not present

## 2018-11-11 DIAGNOSIS — I679 Cerebrovascular disease, unspecified: Secondary | ICD-10-CM | POA: Diagnosis not present

## 2018-11-12 DIAGNOSIS — I10 Essential (primary) hypertension: Secondary | ICD-10-CM | POA: Diagnosis not present

## 2018-11-12 DIAGNOSIS — K219 Gastro-esophageal reflux disease without esophagitis: Secondary | ICD-10-CM | POA: Diagnosis not present

## 2018-11-12 DIAGNOSIS — F329 Major depressive disorder, single episode, unspecified: Secondary | ICD-10-CM | POA: Diagnosis not present

## 2018-11-12 DIAGNOSIS — G309 Alzheimer's disease, unspecified: Secondary | ICD-10-CM | POA: Diagnosis not present

## 2018-11-12 DIAGNOSIS — F0280 Dementia in other diseases classified elsewhere without behavioral disturbance: Secondary | ICD-10-CM | POA: Diagnosis not present

## 2018-11-12 DIAGNOSIS — G40909 Epilepsy, unspecified, not intractable, without status epilepticus: Secondary | ICD-10-CM | POA: Diagnosis not present

## 2018-11-19 DIAGNOSIS — G309 Alzheimer's disease, unspecified: Secondary | ICD-10-CM | POA: Diagnosis not present

## 2018-11-19 DIAGNOSIS — G40909 Epilepsy, unspecified, not intractable, without status epilepticus: Secondary | ICD-10-CM | POA: Diagnosis not present

## 2018-11-19 DIAGNOSIS — F0280 Dementia in other diseases classified elsewhere without behavioral disturbance: Secondary | ICD-10-CM | POA: Diagnosis not present

## 2018-11-19 DIAGNOSIS — I10 Essential (primary) hypertension: Secondary | ICD-10-CM | POA: Diagnosis not present

## 2018-11-19 DIAGNOSIS — K219 Gastro-esophageal reflux disease without esophagitis: Secondary | ICD-10-CM | POA: Diagnosis not present

## 2018-11-19 DIAGNOSIS — F329 Major depressive disorder, single episode, unspecified: Secondary | ICD-10-CM | POA: Diagnosis not present

## 2018-11-21 DIAGNOSIS — F0280 Dementia in other diseases classified elsewhere without behavioral disturbance: Secondary | ICD-10-CM | POA: Diagnosis not present

## 2018-11-21 DIAGNOSIS — G309 Alzheimer's disease, unspecified: Secondary | ICD-10-CM | POA: Diagnosis not present

## 2018-11-21 DIAGNOSIS — K219 Gastro-esophageal reflux disease without esophagitis: Secondary | ICD-10-CM | POA: Diagnosis not present

## 2018-11-21 DIAGNOSIS — G40909 Epilepsy, unspecified, not intractable, without status epilepticus: Secondary | ICD-10-CM | POA: Diagnosis not present

## 2018-11-21 DIAGNOSIS — F329 Major depressive disorder, single episode, unspecified: Secondary | ICD-10-CM | POA: Diagnosis not present

## 2018-11-21 DIAGNOSIS — I10 Essential (primary) hypertension: Secondary | ICD-10-CM | POA: Diagnosis not present

## 2018-12-03 DIAGNOSIS — I10 Essential (primary) hypertension: Secondary | ICD-10-CM | POA: Diagnosis not present

## 2018-12-03 DIAGNOSIS — F329 Major depressive disorder, single episode, unspecified: Secondary | ICD-10-CM | POA: Diagnosis not present

## 2018-12-03 DIAGNOSIS — F0280 Dementia in other diseases classified elsewhere without behavioral disturbance: Secondary | ICD-10-CM | POA: Diagnosis not present

## 2018-12-03 DIAGNOSIS — K219 Gastro-esophageal reflux disease without esophagitis: Secondary | ICD-10-CM | POA: Diagnosis not present

## 2018-12-03 DIAGNOSIS — G40909 Epilepsy, unspecified, not intractable, without status epilepticus: Secondary | ICD-10-CM | POA: Diagnosis not present

## 2018-12-03 DIAGNOSIS — G309 Alzheimer's disease, unspecified: Secondary | ICD-10-CM | POA: Diagnosis not present

## 2018-12-06 DIAGNOSIS — R2681 Unsteadiness on feet: Secondary | ICD-10-CM | POA: Diagnosis not present

## 2018-12-06 DIAGNOSIS — R7309 Other abnormal glucose: Secondary | ICD-10-CM | POA: Diagnosis not present

## 2018-12-06 DIAGNOSIS — E785 Hyperlipidemia, unspecified: Secondary | ICD-10-CM | POA: Diagnosis not present

## 2018-12-06 DIAGNOSIS — G9341 Metabolic encephalopathy: Secondary | ICD-10-CM | POA: Diagnosis not present

## 2018-12-06 DIAGNOSIS — F0280 Dementia in other diseases classified elsewhere without behavioral disturbance: Secondary | ICD-10-CM | POA: Diagnosis not present

## 2018-12-06 DIAGNOSIS — G40909 Epilepsy, unspecified, not intractable, without status epilepticus: Secondary | ICD-10-CM | POA: Diagnosis not present

## 2018-12-06 DIAGNOSIS — M1990 Unspecified osteoarthritis, unspecified site: Secondary | ICD-10-CM | POA: Diagnosis not present

## 2018-12-06 DIAGNOSIS — E871 Hypo-osmolality and hyponatremia: Secondary | ICD-10-CM | POA: Diagnosis not present

## 2018-12-06 DIAGNOSIS — Z885 Allergy status to narcotic agent status: Secondary | ICD-10-CM | POA: Diagnosis not present

## 2018-12-06 DIAGNOSIS — R0602 Shortness of breath: Secondary | ICD-10-CM | POA: Diagnosis not present

## 2018-12-06 DIAGNOSIS — E872 Acidosis: Secondary | ICD-10-CM | POA: Diagnosis not present

## 2018-12-06 DIAGNOSIS — G309 Alzheimer's disease, unspecified: Secondary | ICD-10-CM | POA: Diagnosis not present

## 2018-12-06 DIAGNOSIS — K219 Gastro-esophageal reflux disease without esophagitis: Secondary | ICD-10-CM | POA: Diagnosis not present

## 2018-12-06 DIAGNOSIS — Z7902 Long term (current) use of antithrombotics/antiplatelets: Secondary | ICD-10-CM | POA: Diagnosis not present

## 2018-12-06 DIAGNOSIS — F329 Major depressive disorder, single episode, unspecified: Secondary | ICD-10-CM | POA: Diagnosis not present

## 2018-12-06 DIAGNOSIS — I10 Essential (primary) hypertension: Secondary | ICD-10-CM | POA: Diagnosis not present

## 2018-12-06 DIAGNOSIS — Z79899 Other long term (current) drug therapy: Secondary | ICD-10-CM | POA: Diagnosis not present

## 2018-12-06 DIAGNOSIS — F79 Unspecified intellectual disabilities: Secondary | ICD-10-CM | POA: Diagnosis not present

## 2018-12-16 DIAGNOSIS — F329 Major depressive disorder, single episode, unspecified: Secondary | ICD-10-CM | POA: Diagnosis not present

## 2018-12-16 DIAGNOSIS — K219 Gastro-esophageal reflux disease without esophagitis: Secondary | ICD-10-CM | POA: Diagnosis not present

## 2018-12-16 DIAGNOSIS — G40909 Epilepsy, unspecified, not intractable, without status epilepticus: Secondary | ICD-10-CM | POA: Diagnosis not present

## 2018-12-16 DIAGNOSIS — G309 Alzheimer's disease, unspecified: Secondary | ICD-10-CM | POA: Diagnosis not present

## 2018-12-16 DIAGNOSIS — I10 Essential (primary) hypertension: Secondary | ICD-10-CM | POA: Diagnosis not present

## 2018-12-16 DIAGNOSIS — F0280 Dementia in other diseases classified elsewhere without behavioral disturbance: Secondary | ICD-10-CM | POA: Diagnosis not present

## 2019-01-02 DIAGNOSIS — F329 Major depressive disorder, single episode, unspecified: Secondary | ICD-10-CM | POA: Diagnosis not present

## 2019-01-02 DIAGNOSIS — K219 Gastro-esophageal reflux disease without esophagitis: Secondary | ICD-10-CM | POA: Diagnosis not present

## 2019-01-02 DIAGNOSIS — G40909 Epilepsy, unspecified, not intractable, without status epilepticus: Secondary | ICD-10-CM | POA: Diagnosis not present

## 2019-01-02 DIAGNOSIS — F0280 Dementia in other diseases classified elsewhere without behavioral disturbance: Secondary | ICD-10-CM | POA: Diagnosis not present

## 2019-01-02 DIAGNOSIS — G309 Alzheimer's disease, unspecified: Secondary | ICD-10-CM | POA: Diagnosis not present

## 2019-01-02 DIAGNOSIS — I10 Essential (primary) hypertension: Secondary | ICD-10-CM | POA: Diagnosis not present

## 2019-02-11 DIAGNOSIS — Z23 Encounter for immunization: Secondary | ICD-10-CM | POA: Diagnosis not present

## 2019-03-14 DIAGNOSIS — E871 Hypo-osmolality and hyponatremia: Secondary | ICD-10-CM | POA: Diagnosis not present

## 2019-03-14 DIAGNOSIS — R7309 Other abnormal glucose: Secondary | ICD-10-CM | POA: Diagnosis not present

## 2019-03-14 DIAGNOSIS — R569 Unspecified convulsions: Secondary | ICD-10-CM | POA: Diagnosis not present

## 2019-03-14 DIAGNOSIS — E785 Hyperlipidemia, unspecified: Secondary | ICD-10-CM | POA: Diagnosis not present

## 2019-03-14 DIAGNOSIS — I10 Essential (primary) hypertension: Secondary | ICD-10-CM | POA: Diagnosis not present

## 2019-06-10 DIAGNOSIS — E785 Hyperlipidemia, unspecified: Secondary | ICD-10-CM | POA: Diagnosis not present

## 2019-06-10 DIAGNOSIS — I10 Essential (primary) hypertension: Secondary | ICD-10-CM | POA: Diagnosis not present

## 2019-06-10 DIAGNOSIS — R7309 Other abnormal glucose: Secondary | ICD-10-CM | POA: Diagnosis not present

## 2019-06-10 DIAGNOSIS — E871 Hypo-osmolality and hyponatremia: Secondary | ICD-10-CM | POA: Diagnosis not present

## 2019-09-12 DIAGNOSIS — E871 Hypo-osmolality and hyponatremia: Secondary | ICD-10-CM | POA: Diagnosis not present

## 2019-09-12 DIAGNOSIS — E785 Hyperlipidemia, unspecified: Secondary | ICD-10-CM | POA: Diagnosis not present

## 2019-09-12 DIAGNOSIS — I10 Essential (primary) hypertension: Secondary | ICD-10-CM | POA: Diagnosis not present

## 2019-09-12 DIAGNOSIS — R7309 Other abnormal glucose: Secondary | ICD-10-CM | POA: Diagnosis not present

## 2019-10-28 DIAGNOSIS — M25551 Pain in right hip: Secondary | ICD-10-CM | POA: Diagnosis not present

## 2019-10-28 DIAGNOSIS — Z1231 Encounter for screening mammogram for malignant neoplasm of breast: Secondary | ICD-10-CM | POA: Diagnosis not present

## 2019-10-28 DIAGNOSIS — Z6833 Body mass index (BMI) 33.0-33.9, adult: Secondary | ICD-10-CM | POA: Diagnosis not present

## 2019-10-29 DIAGNOSIS — M25551 Pain in right hip: Secondary | ICD-10-CM | POA: Diagnosis not present

## 2019-10-29 DIAGNOSIS — M159 Polyosteoarthritis, unspecified: Secondary | ICD-10-CM | POA: Diagnosis not present

## 2019-10-29 DIAGNOSIS — Z6833 Body mass index (BMI) 33.0-33.9, adult: Secondary | ICD-10-CM | POA: Diagnosis not present

## 2019-11-04 DIAGNOSIS — Z1231 Encounter for screening mammogram for malignant neoplasm of breast: Secondary | ICD-10-CM | POA: Diagnosis not present

## 2019-11-05 DIAGNOSIS — M25551 Pain in right hip: Secondary | ICD-10-CM | POA: Diagnosis not present

## 2019-11-05 DIAGNOSIS — M159 Polyosteoarthritis, unspecified: Secondary | ICD-10-CM | POA: Diagnosis not present

## 2019-11-05 DIAGNOSIS — Z6833 Body mass index (BMI) 33.0-33.9, adult: Secondary | ICD-10-CM | POA: Diagnosis not present

## 2019-11-19 DIAGNOSIS — M159 Polyosteoarthritis, unspecified: Secondary | ICD-10-CM | POA: Diagnosis not present

## 2019-11-19 DIAGNOSIS — Z6833 Body mass index (BMI) 33.0-33.9, adult: Secondary | ICD-10-CM | POA: Diagnosis not present

## 2019-11-19 DIAGNOSIS — M25551 Pain in right hip: Secondary | ICD-10-CM | POA: Diagnosis not present

## 2019-12-12 DIAGNOSIS — U071 COVID-19: Secondary | ICD-10-CM | POA: Diagnosis not present

## 2019-12-25 DIAGNOSIS — F329 Major depressive disorder, single episode, unspecified: Secondary | ICD-10-CM | POA: Diagnosis not present

## 2019-12-25 DIAGNOSIS — E785 Hyperlipidemia, unspecified: Secondary | ICD-10-CM | POA: Diagnosis not present

## 2019-12-25 DIAGNOSIS — R569 Unspecified convulsions: Secondary | ICD-10-CM | POA: Diagnosis not present

## 2019-12-25 DIAGNOSIS — F0150 Vascular dementia without behavioral disturbance: Secondary | ICD-10-CM | POA: Diagnosis not present

## 2019-12-25 DIAGNOSIS — I10 Essential (primary) hypertension: Secondary | ICD-10-CM | POA: Diagnosis not present

## 2019-12-25 DIAGNOSIS — R7309 Other abnormal glucose: Secondary | ICD-10-CM | POA: Diagnosis not present

## 2019-12-25 DIAGNOSIS — Z6832 Body mass index (BMI) 32.0-32.9, adult: Secondary | ICD-10-CM | POA: Diagnosis not present

## 2019-12-25 DIAGNOSIS — I679 Cerebrovascular disease, unspecified: Secondary | ICD-10-CM | POA: Diagnosis not present

## 2019-12-25 DIAGNOSIS — E871 Hypo-osmolality and hyponatremia: Secondary | ICD-10-CM | POA: Diagnosis not present

## 2019-12-25 DIAGNOSIS — H539 Unspecified visual disturbance: Secondary | ICD-10-CM | POA: Diagnosis not present

## 2020-02-25 DIAGNOSIS — Z23 Encounter for immunization: Secondary | ICD-10-CM | POA: Diagnosis not present

## 2020-03-17 DIAGNOSIS — Z23 Encounter for immunization: Secondary | ICD-10-CM | POA: Diagnosis not present

## 2020-04-08 DIAGNOSIS — I10 Essential (primary) hypertension: Secondary | ICD-10-CM | POA: Diagnosis not present

## 2020-04-08 DIAGNOSIS — J984 Other disorders of lung: Secondary | ICD-10-CM | POA: Diagnosis not present

## 2020-04-08 DIAGNOSIS — S0990XA Unspecified injury of head, initial encounter: Secondary | ICD-10-CM | POA: Diagnosis not present

## 2020-04-08 DIAGNOSIS — R609 Edema, unspecified: Secondary | ICD-10-CM | POA: Diagnosis not present

## 2020-04-08 DIAGNOSIS — R519 Headache, unspecified: Secondary | ICD-10-CM | POA: Diagnosis not present

## 2020-04-08 DIAGNOSIS — F0390 Unspecified dementia without behavioral disturbance: Secondary | ICD-10-CM | POA: Diagnosis not present

## 2020-04-08 DIAGNOSIS — S0003XA Contusion of scalp, initial encounter: Secondary | ICD-10-CM | POA: Diagnosis not present

## 2020-04-08 DIAGNOSIS — Z7902 Long term (current) use of antithrombotics/antiplatelets: Secondary | ICD-10-CM | POA: Diagnosis not present

## 2020-04-08 DIAGNOSIS — G9389 Other specified disorders of brain: Secondary | ICD-10-CM | POA: Diagnosis not present

## 2020-04-08 DIAGNOSIS — Z043 Encounter for examination and observation following other accident: Secondary | ICD-10-CM | POA: Diagnosis not present

## 2020-04-08 DIAGNOSIS — R0902 Hypoxemia: Secondary | ICD-10-CM | POA: Diagnosis not present

## 2020-04-13 DIAGNOSIS — G912 (Idiopathic) normal pressure hydrocephalus: Secondary | ICD-10-CM | POA: Diagnosis not present

## 2020-04-26 DIAGNOSIS — F32A Depression, unspecified: Secondary | ICD-10-CM | POA: Diagnosis not present

## 2020-04-26 DIAGNOSIS — G9389 Other specified disorders of brain: Secondary | ICD-10-CM | POA: Diagnosis not present

## 2020-04-26 DIAGNOSIS — R7309 Other abnormal glucose: Secondary | ICD-10-CM | POA: Diagnosis not present

## 2020-04-26 DIAGNOSIS — F330 Major depressive disorder, recurrent, mild: Secondary | ICD-10-CM | POA: Diagnosis not present

## 2020-04-26 DIAGNOSIS — E669 Obesity, unspecified: Secondary | ICD-10-CM | POA: Diagnosis not present

## 2020-04-26 DIAGNOSIS — R569 Unspecified convulsions: Secondary | ICD-10-CM | POA: Diagnosis not present

## 2020-04-26 DIAGNOSIS — I10 Essential (primary) hypertension: Secondary | ICD-10-CM | POA: Diagnosis not present

## 2020-04-26 DIAGNOSIS — Z6834 Body mass index (BMI) 34.0-34.9, adult: Secondary | ICD-10-CM | POA: Diagnosis not present

## 2020-04-26 DIAGNOSIS — I679 Cerebrovascular disease, unspecified: Secondary | ICD-10-CM | POA: Diagnosis not present

## 2020-04-26 DIAGNOSIS — E785 Hyperlipidemia, unspecified: Secondary | ICD-10-CM | POA: Diagnosis not present

## 2020-04-26 DIAGNOSIS — H539 Unspecified visual disturbance: Secondary | ICD-10-CM | POA: Diagnosis not present

## 2020-04-26 DIAGNOSIS — F0150 Vascular dementia without behavioral disturbance: Secondary | ICD-10-CM | POA: Diagnosis not present

## 2020-04-28 ENCOUNTER — Encounter: Payer: Self-pay | Admitting: Neurology

## 2020-05-10 DIAGNOSIS — H40033 Anatomical narrow angle, bilateral: Secondary | ICD-10-CM | POA: Diagnosis not present

## 2020-05-10 DIAGNOSIS — Z961 Presence of intraocular lens: Secondary | ICD-10-CM | POA: Diagnosis not present

## 2020-05-10 DIAGNOSIS — H524 Presbyopia: Secondary | ICD-10-CM | POA: Diagnosis not present

## 2020-05-19 NOTE — Progress Notes (Unsigned)
Assessment/Plan:   1.  Abnormal brain scan  -Long discussion with patient and family.  Her scans have really not changed since 2018.  It is unclear to me why a lumbar puncture was done without having anybody monitor her pre and post lumbar puncture to evaluate her.  At this point in time, repeating it is not recommended.  Even if ambulation changed, we generally do not put in shunts in patients who already have cognitive change, as cognitive change is not reversible, and it is clear that she has pretty profound cognitive change. Finally, and most importantly, she is not parkinsonian. She really does not need any definition for NPH, and in my opinion does not have NPH.  2. Dementia, likely Alzheimer's  -This very likely needs further work-up, especially since she had neurocognitive testing in 2019 and only MCI was identified. She had hallucinations (my office manager helped her out of her car and she was talking about waiting for the people in the backseat and there were none). She also was fairly easily agitated with her husband. However, she also was not very amenable to the work-up. I gave the patient's husband resources in the community, including [facility], [facility], [facility] [employer] and our memory change packet. I did not recommend the Prevagen that she was on. If she has not had it done previously, I certainly would recommend a B12, folate, RPR,UA next visit with her PCP Subjective:   Peggy Grant was seen today in neurologic consultation at the request of Moon, Amy A, NP.  The consultation is for the evaluation of ventriculomegaly.  Patient previously saw Dr. Melany Guernsey when she was at [facility] neurology in 2018.  Records that are available to me are reviewed.  Patient is a 75 y.o. female with a history of seizure (possibly induced by hyponatremia), dementia per records who presents today for the evaluation of abnormal brain scan.  As above, the patient saw [place]  [facility] in 2018 for possible seizure.  The patient was hyponatremic at the time and the last seizure was in 2019, although she is still on Keppra.    Neurocognitive testing was done in [place] in March, 2019 demonstrating anxiety and depression and MCI.  Patient apparently went to [facility] on January 6 after a fall in which she hit her head on the refrigerator after tripping.  Because of that, she had a CT brain from the emergency room on April 08, 2020 that was reported to show "similar ventriculomegaly, which remains somewhat disproportionate to degree of cerebral volume loss."  This also demonstrated moderate small vessel disease and remote left thalamic infarct.  Patient was apparently seen by neurology through telemedicine.  I do not have any of those notes.  A lumbar puncture was recommended.  I am not sure that this was ever set up with physical therapy.  Patient had a lumbar puncture on April 12, 2020.  It noted that this was for "possible normal pressure hydrocephalus."  It stated that pressure was "normal to low" but did not state what the pressure was.  It was not noted if physical therapy was present to demonstrate walking pre and post lumbar puncture.  pts husband state that no PT was present.  No change in walking pattern after the LP.  pts husband states that it helped her bladder (pt told her that).  She has no tremor. Husband admits that she definitely has memory change. He has noticed she will sometimes forget to take medication  and sometimes she will get angry if he reminds her. They are trying to set up a better system.  I did have the opportunity to review patient's prior MRI scans from October 31, 2018, June 05, 2018 and January 05, 2017.  Between these scans, there was very little change in degree of atrophy.  There is very difficult to assess the difference between the scans and the more recent CT, mostly because of differences in imaging technique between MRI and CT.  However,  radiology even indicates that it is "similar."    ALLERGIES:   Allergies  Allergen Reactions  . Other     Brintellix     CURRENT MEDICATIONS:  Outpatient Encounter Medications as of 05/20/2020  Medication Sig  . acetaminophen (TYLENOL) 325 MG tablet Take 650 mg by mouth 2 (two) times daily as needed.  Marland Kitchen amLODipine (NORVASC) 10 MG tablet Take 10 mg by mouth daily.  Marland Kitchen Apoaequorin (PREVAGEN EXTRA STRENGTH) 20 MG CAPS Take 1 capsule by mouth daily.  . Biotin 5000 MCG TABS Take 1 tablet by mouth daily.  . Calcium Ascorbate 500 MG TABS Take 1 tablet by mouth 2 (two) times daily.  . clopidogrel (PLAVIX) 75 MG tablet Take 75 mg by mouth daily.  . DULoxetine (CYMBALTA) 60 MG capsule Take 60 mg by mouth daily.  . fluticasone (FLONASE) 50 MCG/ACT nasal spray Place 2 sprays into both nostrils daily.  Marland Kitchen levETIRAcetam (KEPPRA) 750 MG tablet Take 750 mg by mouth 2 (two) times daily.  Marland Kitchen lisinopril (ZESTRIL) 10 MG tablet Take 10 mg by mouth daily.  . Multiple Vitamins-Minerals (ONE DAILY COMPLETE PO) Take 1 tablet by mouth daily.  . Omega-3 Fatty Acids (FISH OIL BURP-LESS) 1200 MG CAPS Take 1 capsule by mouth daily.  . rosuvastatin (CRESTOR) 40 MG tablet Take 40 mg by mouth daily.   No facility-administered encounter medications on file as of 05/20/2020.    Objective:   PHYSICAL EXAMINATION:    VITALS:   Vitals:   05/20/20 1339  BP: 118/72  Pulse: (!) 59  SpO2: 96%  Weight: 184 lb (83.5 kg)  Height: 5\' 4"  (1.626 m)    GEN:  Normal appears female in no acute distress.  Appears stated age.  She is emotionally labile. She was quite pleasant with me most of the time, but at other times she was angry and irritated with her husband and some with my staff. HEENT:  Normocephalic, atraumatic. The mucous membranes are moist. The superficial temporal arteries are without ropiness or tenderness. Cardiovascular: Regular rate and rhythm. Lungs: Clear to auscultation bilaterally. Neck/Heme: There are  no carotid bruits noted bilaterally.  NEUROLOGICAL: Orientation:  The patient is alert and oriented to person only.    Cranial nerves: There is good facial symmetry.  Extraocular muscles are intact and visual fields are full to confrontational testing. Speech is fluent and clear. Soft palate rises symmetrically and there is no tongue deviation. Hearing is intact to conversational tone. Tone: Tone is good throughout. Sensation: Sensation is intact to light touch and pinprick throughout (facial, trunk, extremities). Vibration is intact at the bilateral big toe. There is no extinction with double simultaneous stimulation. There is no sensory dermatomal level identified. Coordination:  The patient has no difficulty with RAM's or FNF bilaterally, with the exception of the fact that she is apraxic with these commands Motor: Strength is 5/5 in the bilateral upper and lower extremities.  Shoulder shrug is equal and symmetric. There is no pronator drift.  There  are no fasciculations noted. DTR's: Deep tendon reflexes are 2/4 at the bilateral biceps, triceps, brachioradialis, patella and achilles.  Plantar responses are downgoing bilaterally. Gait and Station: The patient ambulates with her cane. She is just slightly unsteady. She does not shuffle.  I have reviewed and interpreted the following labs independently January lab work from primary care is reviewed.  Sodium is 146, potassium 4.9, chloride 109, CO2 27, BUN 17, creatinine 0.87, AST 20, ALT 15, white blood cell 7.1, hemoglobin 13.4, hematocrit 40.7, platelets 292  Total time spent on today's visit was 60 minutes, including both face-to-face time and nonface-to-face time.  Time included that spent on review of records (prior notes available to me/labs/imaging if pertinent), discussing treatment and goals, answering patient's questions and coordinating care.   Cc:  Hurshel Party, NP

## 2020-05-20 ENCOUNTER — Encounter: Payer: Self-pay | Admitting: Neurology

## 2020-05-20 ENCOUNTER — Other Ambulatory Visit: Payer: Self-pay

## 2020-05-20 ENCOUNTER — Ambulatory Visit (INDEPENDENT_AMBULATORY_CARE_PROVIDER_SITE_OTHER): Payer: Medicare Other | Admitting: Neurology

## 2020-05-20 VITALS — BP 118/72 | HR 59 | Ht 64.0 in | Wt 184.0 lb

## 2020-05-20 DIAGNOSIS — R9402 Abnormal brain scan: Secondary | ICD-10-CM

## 2020-05-20 DIAGNOSIS — F0281 Dementia in other diseases classified elsewhere with behavioral disturbance: Secondary | ICD-10-CM | POA: Diagnosis not present

## 2020-05-20 DIAGNOSIS — G20 Parkinson's disease: Secondary | ICD-10-CM

## 2020-05-20 NOTE — Patient Instructions (Signed)
1.  You do not have NPH and do not need any spinal taps or further procedures 2.  I don't recommend the prevagen 3.  There are other memory medications that are possiblity.  If you decide to consider in the future, you can make another appointment or discuss with Peggy Grant, Peggy A, NP.  The physicians and staff at [facility] are committed to providing excellent care. You may receive Grant survey requesting feedback about your experience at our office. We strive to receive "very good" responses to the survey questions. If you feel that your experience would prevent you from giving the office Grant "very good " response, please contact our office to try to remedy the situation. We may be reached at [phone]. Thank you for taking the time out of your busy day to complete the survey.

## 2020-05-27 ENCOUNTER — Telehealth: Payer: Self-pay | Admitting: Neurology

## 2020-05-27 NOTE — Telephone Encounter (Signed)
The Referring provider's office called needing the notes for the patient's 05/20/20 visit faxed to [phone].

## 2020-05-27 NOTE — Telephone Encounter (Signed)
Dr Don Perking most recent office note printed and will be faxed.

## 2020-08-24 DIAGNOSIS — F330 Major depressive disorder, recurrent, mild: Secondary | ICD-10-CM | POA: Diagnosis not present

## 2020-08-24 DIAGNOSIS — R7309 Other abnormal glucose: Secondary | ICD-10-CM | POA: Diagnosis not present

## 2020-08-24 DIAGNOSIS — E669 Obesity, unspecified: Secondary | ICD-10-CM | POA: Diagnosis not present

## 2020-08-24 DIAGNOSIS — Z6834 Body mass index (BMI) 34.0-34.9, adult: Secondary | ICD-10-CM | POA: Diagnosis not present

## 2020-08-24 DIAGNOSIS — I679 Cerebrovascular disease, unspecified: Secondary | ICD-10-CM | POA: Diagnosis not present

## 2020-08-24 DIAGNOSIS — E785 Hyperlipidemia, unspecified: Secondary | ICD-10-CM | POA: Diagnosis not present

## 2020-08-24 DIAGNOSIS — F0150 Vascular dementia without behavioral disturbance: Secondary | ICD-10-CM | POA: Diagnosis not present

## 2020-08-24 DIAGNOSIS — I10 Essential (primary) hypertension: Secondary | ICD-10-CM | POA: Diagnosis not present

## 2020-08-24 DIAGNOSIS — F32A Depression, unspecified: Secondary | ICD-10-CM | POA: Diagnosis not present

## 2020-08-24 DIAGNOSIS — R569 Unspecified convulsions: Secondary | ICD-10-CM | POA: Diagnosis not present

## 2020-10-06 DIAGNOSIS — Z20822 Contact with and (suspected) exposure to covid-19: Secondary | ICD-10-CM | POA: Diagnosis not present

## 2020-12-27 DIAGNOSIS — E669 Obesity, unspecified: Secondary | ICD-10-CM | POA: Diagnosis not present

## 2020-12-27 DIAGNOSIS — F32A Depression, unspecified: Secondary | ICD-10-CM | POA: Diagnosis not present

## 2020-12-27 DIAGNOSIS — R569 Unspecified convulsions: Secondary | ICD-10-CM | POA: Diagnosis not present

## 2020-12-27 DIAGNOSIS — E538 Deficiency of other specified B group vitamins: Secondary | ICD-10-CM | POA: Diagnosis not present

## 2020-12-27 DIAGNOSIS — I10 Essential (primary) hypertension: Secondary | ICD-10-CM | POA: Diagnosis not present

## 2020-12-27 DIAGNOSIS — Z23 Encounter for immunization: Secondary | ICD-10-CM | POA: Diagnosis not present

## 2020-12-27 DIAGNOSIS — F0150 Vascular dementia without behavioral disturbance: Secondary | ICD-10-CM | POA: Diagnosis not present

## 2020-12-27 DIAGNOSIS — I679 Cerebrovascular disease, unspecified: Secondary | ICD-10-CM | POA: Diagnosis not present

## 2020-12-27 DIAGNOSIS — R7309 Other abnormal glucose: Secondary | ICD-10-CM | POA: Diagnosis not present

## 2020-12-27 DIAGNOSIS — F330 Major depressive disorder, recurrent, mild: Secondary | ICD-10-CM | POA: Diagnosis not present

## 2020-12-27 DIAGNOSIS — E785 Hyperlipidemia, unspecified: Secondary | ICD-10-CM | POA: Diagnosis not present

## 2020-12-27 DIAGNOSIS — R32 Unspecified urinary incontinence: Secondary | ICD-10-CM | POA: Diagnosis not present

## 2020-12-27 DIAGNOSIS — Z6835 Body mass index (BMI) 35.0-35.9, adult: Secondary | ICD-10-CM | POA: Diagnosis not present

## 2021-01-28 DIAGNOSIS — F0153 Vascular dementia, unspecified severity, with mood disturbance: Secondary | ICD-10-CM | POA: Diagnosis not present

## 2021-01-28 DIAGNOSIS — I679 Cerebrovascular disease, unspecified: Secondary | ICD-10-CM | POA: Diagnosis not present

## 2021-02-17 DIAGNOSIS — Z20828 Contact with and (suspected) exposure to other viral communicable diseases: Secondary | ICD-10-CM | POA: Diagnosis not present

## 2021-03-07 DIAGNOSIS — R159 Full incontinence of feces: Secondary | ICD-10-CM | POA: Diagnosis not present

## 2021-03-07 DIAGNOSIS — R197 Diarrhea, unspecified: Secondary | ICD-10-CM | POA: Diagnosis not present

## 2021-03-07 DIAGNOSIS — R531 Weakness: Secondary | ICD-10-CM | POA: Diagnosis not present

## 2021-03-07 DIAGNOSIS — R152 Fecal urgency: Secondary | ICD-10-CM | POA: Diagnosis not present

## 2021-04-07 DIAGNOSIS — R197 Diarrhea, unspecified: Secondary | ICD-10-CM | POA: Diagnosis not present

## 2021-04-07 DIAGNOSIS — R159 Full incontinence of feces: Secondary | ICD-10-CM | POA: Diagnosis not present

## 2021-04-07 DIAGNOSIS — R152 Fecal urgency: Secondary | ICD-10-CM | POA: Diagnosis not present

## 2021-04-07 DIAGNOSIS — R531 Weakness: Secondary | ICD-10-CM | POA: Diagnosis not present

## 2021-05-02 DIAGNOSIS — Z6832 Body mass index (BMI) 32.0-32.9, adult: Secondary | ICD-10-CM | POA: Diagnosis not present

## 2021-05-02 DIAGNOSIS — E785 Hyperlipidemia, unspecified: Secondary | ICD-10-CM | POA: Diagnosis not present

## 2021-05-02 DIAGNOSIS — R7309 Other abnormal glucose: Secondary | ICD-10-CM | POA: Diagnosis not present

## 2021-05-02 DIAGNOSIS — F0153 Vascular dementia, unspecified severity, with mood disturbance: Secondary | ICD-10-CM | POA: Diagnosis not present

## 2021-05-02 DIAGNOSIS — I10 Essential (primary) hypertension: Secondary | ICD-10-CM | POA: Diagnosis not present

## 2021-05-02 DIAGNOSIS — I679 Cerebrovascular disease, unspecified: Secondary | ICD-10-CM | POA: Diagnosis not present

## 2021-05-02 DIAGNOSIS — Z1231 Encounter for screening mammogram for malignant neoplasm of breast: Secondary | ICD-10-CM | POA: Diagnosis not present

## 2021-05-02 DIAGNOSIS — R32 Unspecified urinary incontinence: Secondary | ICD-10-CM | POA: Diagnosis not present

## 2021-05-02 DIAGNOSIS — R569 Unspecified convulsions: Secondary | ICD-10-CM | POA: Diagnosis not present

## 2021-05-02 DIAGNOSIS — E669 Obesity, unspecified: Secondary | ICD-10-CM | POA: Diagnosis not present

## 2021-05-02 DIAGNOSIS — F330 Major depressive disorder, recurrent, mild: Secondary | ICD-10-CM | POA: Diagnosis not present

## 2021-05-17 DIAGNOSIS — Z961 Presence of intraocular lens: Secondary | ICD-10-CM | POA: Diagnosis not present

## 2021-05-17 DIAGNOSIS — H40033 Anatomical narrow angle, bilateral: Secondary | ICD-10-CM | POA: Diagnosis not present

## 2021-05-30 DIAGNOSIS — Z20822 Contact with and (suspected) exposure to covid-19: Secondary | ICD-10-CM | POA: Diagnosis not present

## 2021-06-01 DIAGNOSIS — R928 Other abnormal and inconclusive findings on diagnostic imaging of breast: Secondary | ICD-10-CM | POA: Diagnosis not present

## 2021-06-01 DIAGNOSIS — Z1231 Encounter for screening mammogram for malignant neoplasm of breast: Secondary | ICD-10-CM | POA: Diagnosis not present

## 2021-06-14 DIAGNOSIS — Z20822 Contact with and (suspected) exposure to covid-19: Secondary | ICD-10-CM | POA: Diagnosis not present

## 2021-06-21 DIAGNOSIS — Z20822 Contact with and (suspected) exposure to covid-19: Secondary | ICD-10-CM | POA: Diagnosis not present

## 2021-07-06 DIAGNOSIS — N6489 Other specified disorders of breast: Secondary | ICD-10-CM | POA: Diagnosis not present

## 2021-07-06 DIAGNOSIS — R928 Other abnormal and inconclusive findings on diagnostic imaging of breast: Secondary | ICD-10-CM | POA: Diagnosis not present

## 2021-07-08 ENCOUNTER — Other Ambulatory Visit: Payer: Self-pay | Admitting: Internal Medicine

## 2021-07-08 DIAGNOSIS — R928 Other abnormal and inconclusive findings on diagnostic imaging of breast: Secondary | ICD-10-CM

## 2021-07-08 DIAGNOSIS — Z20822 Contact with and (suspected) exposure to covid-19: Secondary | ICD-10-CM | POA: Diagnosis not present

## 2021-07-14 DIAGNOSIS — R059 Cough, unspecified: Secondary | ICD-10-CM | POA: Diagnosis not present

## 2021-07-14 DIAGNOSIS — R051 Acute cough: Secondary | ICD-10-CM | POA: Diagnosis not present

## 2021-07-14 DIAGNOSIS — Z20822 Contact with and (suspected) exposure to covid-19: Secondary | ICD-10-CM | POA: Diagnosis not present

## 2021-08-08 DIAGNOSIS — Z20822 Contact with and (suspected) exposure to covid-19: Secondary | ICD-10-CM | POA: Diagnosis not present

## 2021-08-11 ENCOUNTER — Other Ambulatory Visit: Payer: Self-pay | Admitting: Internal Medicine

## 2021-08-11 DIAGNOSIS — R569 Unspecified convulsions: Secondary | ICD-10-CM | POA: Diagnosis not present

## 2021-08-11 DIAGNOSIS — I10 Essential (primary) hypertension: Secondary | ICD-10-CM | POA: Diagnosis not present

## 2021-08-11 DIAGNOSIS — R928 Other abnormal and inconclusive findings on diagnostic imaging of breast: Secondary | ICD-10-CM | POA: Diagnosis not present

## 2021-08-11 DIAGNOSIS — I679 Cerebrovascular disease, unspecified: Secondary | ICD-10-CM | POA: Diagnosis not present

## 2021-08-11 DIAGNOSIS — E785 Hyperlipidemia, unspecified: Secondary | ICD-10-CM | POA: Diagnosis not present

## 2021-08-11 DIAGNOSIS — F330 Major depressive disorder, recurrent, mild: Secondary | ICD-10-CM | POA: Diagnosis not present

## 2021-08-11 DIAGNOSIS — R7309 Other abnormal glucose: Secondary | ICD-10-CM | POA: Diagnosis not present

## 2021-08-11 DIAGNOSIS — F0153 Vascular dementia, unspecified severity, with mood disturbance: Secondary | ICD-10-CM | POA: Diagnosis not present

## 2021-08-11 DIAGNOSIS — N6489 Other specified disorders of breast: Secondary | ICD-10-CM

## 2021-08-19 ENCOUNTER — Ambulatory Visit
Admission: RE | Admit: 2021-08-19 | Discharge: 2021-08-19 | Disposition: A | Discharge status: Home / Self Care | Payer: Medicare Other | Source: Ambulatory Visit | Attending: Internal Medicine | Admitting: Internal Medicine

## 2021-08-19 DIAGNOSIS — N6489 Other specified disorders of breast: Secondary | ICD-10-CM

## 2021-08-19 DIAGNOSIS — R928 Other abnormal and inconclusive findings on diagnostic imaging of breast: Secondary | ICD-10-CM

## 2021-08-19 DIAGNOSIS — N61 Mastitis without abscess: Secondary | ICD-10-CM | POA: Diagnosis not present

## 2021-08-19 DIAGNOSIS — N641 Fat necrosis of breast: Secondary | ICD-10-CM | POA: Diagnosis not present

## 2021-10-13 DIAGNOSIS — M1611 Unilateral primary osteoarthritis, right hip: Secondary | ICD-10-CM | POA: Diagnosis not present

## 2021-10-13 DIAGNOSIS — M5416 Radiculopathy, lumbar region: Secondary | ICD-10-CM | POA: Diagnosis not present

## 2021-10-13 DIAGNOSIS — M25551 Pain in right hip: Secondary | ICD-10-CM | POA: Diagnosis not present

## 2021-10-26 DIAGNOSIS — K5730 Diverticulosis of large intestine without perforation or abscess without bleeding: Secondary | ICD-10-CM | POA: Diagnosis not present

## 2021-10-26 DIAGNOSIS — Z1211 Encounter for screening for malignant neoplasm of colon: Secondary | ICD-10-CM | POA: Diagnosis not present

## 2021-11-14 DIAGNOSIS — M25551 Pain in right hip: Secondary | ICD-10-CM | POA: Diagnosis not present

## 2021-11-14 DIAGNOSIS — Z139 Encounter for screening, unspecified: Secondary | ICD-10-CM | POA: Diagnosis not present

## 2021-11-14 DIAGNOSIS — Z9181 History of falling: Secondary | ICD-10-CM | POA: Diagnosis not present

## 2021-11-14 DIAGNOSIS — I10 Essential (primary) hypertension: Secondary | ICD-10-CM | POA: Diagnosis not present

## 2021-11-14 DIAGNOSIS — R569 Unspecified convulsions: Secondary | ICD-10-CM | POA: Diagnosis not present

## 2021-11-14 DIAGNOSIS — I679 Cerebrovascular disease, unspecified: Secondary | ICD-10-CM | POA: Diagnosis not present

## 2021-11-14 DIAGNOSIS — R7309 Other abnormal glucose: Secondary | ICD-10-CM | POA: Diagnosis not present

## 2021-11-14 DIAGNOSIS — F330 Major depressive disorder, recurrent, mild: Secondary | ICD-10-CM | POA: Diagnosis not present

## 2021-11-14 DIAGNOSIS — E785 Hyperlipidemia, unspecified: Secondary | ICD-10-CM | POA: Diagnosis not present

## 2021-11-14 DIAGNOSIS — F0153 Vascular dementia, unspecified severity, with mood disturbance: Secondary | ICD-10-CM | POA: Diagnosis not present

## 2022-02-14 DIAGNOSIS — R7309 Other abnormal glucose: Secondary | ICD-10-CM | POA: Diagnosis not present

## 2022-02-14 DIAGNOSIS — R569 Unspecified convulsions: Secondary | ICD-10-CM | POA: Diagnosis not present

## 2022-02-14 DIAGNOSIS — I679 Cerebrovascular disease, unspecified: Secondary | ICD-10-CM | POA: Diagnosis not present

## 2022-02-14 DIAGNOSIS — R262 Difficulty in walking, not elsewhere classified: Secondary | ICD-10-CM | POA: Diagnosis not present

## 2022-02-14 DIAGNOSIS — E785 Hyperlipidemia, unspecified: Secondary | ICD-10-CM | POA: Diagnosis not present

## 2022-02-14 DIAGNOSIS — F0153 Vascular dementia, unspecified severity, with mood disturbance: Secondary | ICD-10-CM | POA: Diagnosis not present

## 2022-02-14 DIAGNOSIS — Z23 Encounter for immunization: Secondary | ICD-10-CM | POA: Diagnosis not present

## 2022-02-14 DIAGNOSIS — I10 Essential (primary) hypertension: Secondary | ICD-10-CM | POA: Diagnosis not present

## 2022-03-08 DIAGNOSIS — Z23 Encounter for immunization: Secondary | ICD-10-CM | POA: Diagnosis not present

## 2022-03-17 DIAGNOSIS — M25652 Stiffness of left hip, not elsewhere classified: Secondary | ICD-10-CM | POA: Diagnosis not present

## 2022-03-17 DIAGNOSIS — M2560 Stiffness of unspecified joint, not elsewhere classified: Secondary | ICD-10-CM | POA: Diagnosis not present

## 2022-03-17 DIAGNOSIS — R2689 Other abnormalities of gait and mobility: Secondary | ICD-10-CM | POA: Diagnosis not present

## 2022-03-17 DIAGNOSIS — M25651 Stiffness of right hip, not elsewhere classified: Secondary | ICD-10-CM | POA: Diagnosis not present

## 2022-03-17 DIAGNOSIS — R2681 Unsteadiness on feet: Secondary | ICD-10-CM | POA: Diagnosis not present

## 2022-03-17 DIAGNOSIS — R482 Apraxia: Secondary | ICD-10-CM | POA: Diagnosis not present

## 2022-03-17 DIAGNOSIS — M6281 Muscle weakness (generalized): Secondary | ICD-10-CM | POA: Diagnosis not present

## 2022-03-17 DIAGNOSIS — Z7409 Other reduced mobility: Secondary | ICD-10-CM | POA: Diagnosis not present

## 2022-03-17 DIAGNOSIS — R293 Abnormal posture: Secondary | ICD-10-CM | POA: Diagnosis not present

## 2022-03-21 DIAGNOSIS — M2560 Stiffness of unspecified joint, not elsewhere classified: Secondary | ICD-10-CM | POA: Diagnosis not present

## 2022-03-21 DIAGNOSIS — M6281 Muscle weakness (generalized): Secondary | ICD-10-CM | POA: Diagnosis not present

## 2022-03-21 DIAGNOSIS — M25652 Stiffness of left hip, not elsewhere classified: Secondary | ICD-10-CM | POA: Diagnosis not present

## 2022-03-21 DIAGNOSIS — R2689 Other abnormalities of gait and mobility: Secondary | ICD-10-CM | POA: Diagnosis not present

## 2022-03-21 DIAGNOSIS — M25651 Stiffness of right hip, not elsewhere classified: Secondary | ICD-10-CM | POA: Diagnosis not present

## 2022-03-21 DIAGNOSIS — R293 Abnormal posture: Secondary | ICD-10-CM | POA: Diagnosis not present

## 2022-03-23 DIAGNOSIS — R2689 Other abnormalities of gait and mobility: Secondary | ICD-10-CM | POA: Diagnosis not present

## 2022-03-23 DIAGNOSIS — M2560 Stiffness of unspecified joint, not elsewhere classified: Secondary | ICD-10-CM | POA: Diagnosis not present

## 2022-03-23 DIAGNOSIS — M6281 Muscle weakness (generalized): Secondary | ICD-10-CM | POA: Diagnosis not present

## 2022-03-23 DIAGNOSIS — M25651 Stiffness of right hip, not elsewhere classified: Secondary | ICD-10-CM | POA: Diagnosis not present

## 2022-03-23 DIAGNOSIS — R293 Abnormal posture: Secondary | ICD-10-CM | POA: Diagnosis not present

## 2022-03-23 DIAGNOSIS — M25652 Stiffness of left hip, not elsewhere classified: Secondary | ICD-10-CM | POA: Diagnosis not present

## 2022-03-28 DIAGNOSIS — M25652 Stiffness of left hip, not elsewhere classified: Secondary | ICD-10-CM | POA: Diagnosis not present

## 2022-03-28 DIAGNOSIS — M2560 Stiffness of unspecified joint, not elsewhere classified: Secondary | ICD-10-CM | POA: Diagnosis not present

## 2022-03-28 DIAGNOSIS — R293 Abnormal posture: Secondary | ICD-10-CM | POA: Diagnosis not present

## 2022-03-28 DIAGNOSIS — R2689 Other abnormalities of gait and mobility: Secondary | ICD-10-CM | POA: Diagnosis not present

## 2022-03-28 DIAGNOSIS — M25651 Stiffness of right hip, not elsewhere classified: Secondary | ICD-10-CM | POA: Diagnosis not present

## 2022-03-28 DIAGNOSIS — M6281 Muscle weakness (generalized): Secondary | ICD-10-CM | POA: Diagnosis not present

## 2022-04-04 DIAGNOSIS — R482 Apraxia: Secondary | ICD-10-CM | POA: Diagnosis not present

## 2022-04-04 DIAGNOSIS — M25652 Stiffness of left hip, not elsewhere classified: Secondary | ICD-10-CM | POA: Diagnosis not present

## 2022-04-04 DIAGNOSIS — M25651 Stiffness of right hip, not elsewhere classified: Secondary | ICD-10-CM | POA: Diagnosis not present

## 2022-04-04 DIAGNOSIS — R2681 Unsteadiness on feet: Secondary | ICD-10-CM | POA: Diagnosis not present

## 2022-04-04 DIAGNOSIS — Z7409 Other reduced mobility: Secondary | ICD-10-CM | POA: Diagnosis not present

## 2022-04-04 DIAGNOSIS — R2689 Other abnormalities of gait and mobility: Secondary | ICD-10-CM | POA: Diagnosis not present

## 2022-04-04 DIAGNOSIS — R293 Abnormal posture: Secondary | ICD-10-CM | POA: Diagnosis not present

## 2022-04-04 DIAGNOSIS — M6281 Muscle weakness (generalized): Secondary | ICD-10-CM | POA: Diagnosis not present

## 2022-04-04 DIAGNOSIS — M2560 Stiffness of unspecified joint, not elsewhere classified: Secondary | ICD-10-CM | POA: Diagnosis not present

## 2022-04-11 DIAGNOSIS — M25652 Stiffness of left hip, not elsewhere classified: Secondary | ICD-10-CM | POA: Diagnosis not present

## 2022-04-11 DIAGNOSIS — R2689 Other abnormalities of gait and mobility: Secondary | ICD-10-CM | POA: Diagnosis not present

## 2022-04-11 DIAGNOSIS — R293 Abnormal posture: Secondary | ICD-10-CM | POA: Diagnosis not present

## 2022-04-11 DIAGNOSIS — M25651 Stiffness of right hip, not elsewhere classified: Secondary | ICD-10-CM | POA: Diagnosis not present

## 2022-04-11 DIAGNOSIS — M2560 Stiffness of unspecified joint, not elsewhere classified: Secondary | ICD-10-CM | POA: Diagnosis not present

## 2022-04-11 DIAGNOSIS — M6281 Muscle weakness (generalized): Secondary | ICD-10-CM | POA: Diagnosis not present

## 2022-04-13 DIAGNOSIS — R293 Abnormal posture: Secondary | ICD-10-CM | POA: Diagnosis not present

## 2022-04-13 DIAGNOSIS — R2689 Other abnormalities of gait and mobility: Secondary | ICD-10-CM | POA: Diagnosis not present

## 2022-04-13 DIAGNOSIS — M25652 Stiffness of left hip, not elsewhere classified: Secondary | ICD-10-CM | POA: Diagnosis not present

## 2022-04-13 DIAGNOSIS — M6281 Muscle weakness (generalized): Secondary | ICD-10-CM | POA: Diagnosis not present

## 2022-04-13 DIAGNOSIS — M25651 Stiffness of right hip, not elsewhere classified: Secondary | ICD-10-CM | POA: Diagnosis not present

## 2022-04-13 DIAGNOSIS — M2560 Stiffness of unspecified joint, not elsewhere classified: Secondary | ICD-10-CM | POA: Diagnosis not present

## 2022-04-19 DIAGNOSIS — R293 Abnormal posture: Secondary | ICD-10-CM | POA: Diagnosis not present

## 2022-04-19 DIAGNOSIS — M6281 Muscle weakness (generalized): Secondary | ICD-10-CM | POA: Diagnosis not present

## 2022-04-19 DIAGNOSIS — M25652 Stiffness of left hip, not elsewhere classified: Secondary | ICD-10-CM | POA: Diagnosis not present

## 2022-04-19 DIAGNOSIS — R2689 Other abnormalities of gait and mobility: Secondary | ICD-10-CM | POA: Diagnosis not present

## 2022-04-19 DIAGNOSIS — M25651 Stiffness of right hip, not elsewhere classified: Secondary | ICD-10-CM | POA: Diagnosis not present

## 2022-04-19 DIAGNOSIS — M2560 Stiffness of unspecified joint, not elsewhere classified: Secondary | ICD-10-CM | POA: Diagnosis not present

## 2022-04-21 DIAGNOSIS — M2560 Stiffness of unspecified joint, not elsewhere classified: Secondary | ICD-10-CM | POA: Diagnosis not present

## 2022-04-21 DIAGNOSIS — R2689 Other abnormalities of gait and mobility: Secondary | ICD-10-CM | POA: Diagnosis not present

## 2022-04-21 DIAGNOSIS — R293 Abnormal posture: Secondary | ICD-10-CM | POA: Diagnosis not present

## 2022-04-21 DIAGNOSIS — M6281 Muscle weakness (generalized): Secondary | ICD-10-CM | POA: Diagnosis not present

## 2022-04-21 DIAGNOSIS — M25651 Stiffness of right hip, not elsewhere classified: Secondary | ICD-10-CM | POA: Diagnosis not present

## 2022-04-21 DIAGNOSIS — M25652 Stiffness of left hip, not elsewhere classified: Secondary | ICD-10-CM | POA: Diagnosis not present

## 2022-04-25 DIAGNOSIS — M6281 Muscle weakness (generalized): Secondary | ICD-10-CM | POA: Diagnosis not present

## 2022-04-25 DIAGNOSIS — M25652 Stiffness of left hip, not elsewhere classified: Secondary | ICD-10-CM | POA: Diagnosis not present

## 2022-04-25 DIAGNOSIS — M25651 Stiffness of right hip, not elsewhere classified: Secondary | ICD-10-CM | POA: Diagnosis not present

## 2022-04-25 DIAGNOSIS — R293 Abnormal posture: Secondary | ICD-10-CM | POA: Diagnosis not present

## 2022-04-25 DIAGNOSIS — R2689 Other abnormalities of gait and mobility: Secondary | ICD-10-CM | POA: Diagnosis not present

## 2022-04-25 DIAGNOSIS — M2560 Stiffness of unspecified joint, not elsewhere classified: Secondary | ICD-10-CM | POA: Diagnosis not present

## 2022-04-27 DIAGNOSIS — M25651 Stiffness of right hip, not elsewhere classified: Secondary | ICD-10-CM | POA: Diagnosis not present

## 2022-04-27 DIAGNOSIS — R2689 Other abnormalities of gait and mobility: Secondary | ICD-10-CM | POA: Diagnosis not present

## 2022-04-27 DIAGNOSIS — R293 Abnormal posture: Secondary | ICD-10-CM | POA: Diagnosis not present

## 2022-04-27 DIAGNOSIS — M25652 Stiffness of left hip, not elsewhere classified: Secondary | ICD-10-CM | POA: Diagnosis not present

## 2022-04-27 DIAGNOSIS — M2560 Stiffness of unspecified joint, not elsewhere classified: Secondary | ICD-10-CM | POA: Diagnosis not present

## 2022-04-27 DIAGNOSIS — M6281 Muscle weakness (generalized): Secondary | ICD-10-CM | POA: Diagnosis not present

## 2022-05-02 DIAGNOSIS — R2689 Other abnormalities of gait and mobility: Secondary | ICD-10-CM | POA: Diagnosis not present

## 2022-05-02 DIAGNOSIS — R293 Abnormal posture: Secondary | ICD-10-CM | POA: Diagnosis not present

## 2022-05-02 DIAGNOSIS — M25651 Stiffness of right hip, not elsewhere classified: Secondary | ICD-10-CM | POA: Diagnosis not present

## 2022-05-02 DIAGNOSIS — M6281 Muscle weakness (generalized): Secondary | ICD-10-CM | POA: Diagnosis not present

## 2022-05-02 DIAGNOSIS — M25652 Stiffness of left hip, not elsewhere classified: Secondary | ICD-10-CM | POA: Diagnosis not present

## 2022-05-02 DIAGNOSIS — M2560 Stiffness of unspecified joint, not elsewhere classified: Secondary | ICD-10-CM | POA: Diagnosis not present

## 2022-05-04 DIAGNOSIS — M6281 Muscle weakness (generalized): Secondary | ICD-10-CM | POA: Diagnosis not present

## 2022-05-04 DIAGNOSIS — R2681 Unsteadiness on feet: Secondary | ICD-10-CM | POA: Diagnosis not present

## 2022-05-04 DIAGNOSIS — R293 Abnormal posture: Secondary | ICD-10-CM | POA: Diagnosis not present

## 2022-05-04 DIAGNOSIS — M25651 Stiffness of right hip, not elsewhere classified: Secondary | ICD-10-CM | POA: Diagnosis not present

## 2022-05-04 DIAGNOSIS — M25652 Stiffness of left hip, not elsewhere classified: Secondary | ICD-10-CM | POA: Diagnosis not present

## 2022-05-04 DIAGNOSIS — M2560 Stiffness of unspecified joint, not elsewhere classified: Secondary | ICD-10-CM | POA: Diagnosis not present

## 2022-05-04 DIAGNOSIS — R482 Apraxia: Secondary | ICD-10-CM | POA: Diagnosis not present

## 2022-05-09 DIAGNOSIS — M25651 Stiffness of right hip, not elsewhere classified: Secondary | ICD-10-CM | POA: Diagnosis not present

## 2022-05-09 DIAGNOSIS — M25652 Stiffness of left hip, not elsewhere classified: Secondary | ICD-10-CM | POA: Diagnosis not present

## 2022-05-09 DIAGNOSIS — M6281 Muscle weakness (generalized): Secondary | ICD-10-CM | POA: Diagnosis not present

## 2022-05-09 DIAGNOSIS — R2681 Unsteadiness on feet: Secondary | ICD-10-CM | POA: Diagnosis not present

## 2022-05-09 DIAGNOSIS — R293 Abnormal posture: Secondary | ICD-10-CM | POA: Diagnosis not present

## 2022-05-09 DIAGNOSIS — M2560 Stiffness of unspecified joint, not elsewhere classified: Secondary | ICD-10-CM | POA: Diagnosis not present

## 2022-05-11 DIAGNOSIS — M2560 Stiffness of unspecified joint, not elsewhere classified: Secondary | ICD-10-CM | POA: Diagnosis not present

## 2022-05-11 DIAGNOSIS — M6281 Muscle weakness (generalized): Secondary | ICD-10-CM | POA: Diagnosis not present

## 2022-05-11 DIAGNOSIS — M25652 Stiffness of left hip, not elsewhere classified: Secondary | ICD-10-CM | POA: Diagnosis not present

## 2022-05-11 DIAGNOSIS — R293 Abnormal posture: Secondary | ICD-10-CM | POA: Diagnosis not present

## 2022-05-11 DIAGNOSIS — M25651 Stiffness of right hip, not elsewhere classified: Secondary | ICD-10-CM | POA: Diagnosis not present

## 2022-05-11 DIAGNOSIS — R2681 Unsteadiness on feet: Secondary | ICD-10-CM | POA: Diagnosis not present

## 2022-05-16 DIAGNOSIS — M6281 Muscle weakness (generalized): Secondary | ICD-10-CM | POA: Diagnosis not present

## 2022-05-16 DIAGNOSIS — R2681 Unsteadiness on feet: Secondary | ICD-10-CM | POA: Diagnosis not present

## 2022-05-16 DIAGNOSIS — R293 Abnormal posture: Secondary | ICD-10-CM | POA: Diagnosis not present

## 2022-05-16 DIAGNOSIS — M25651 Stiffness of right hip, not elsewhere classified: Secondary | ICD-10-CM | POA: Diagnosis not present

## 2022-05-16 DIAGNOSIS — M25652 Stiffness of left hip, not elsewhere classified: Secondary | ICD-10-CM | POA: Diagnosis not present

## 2022-05-16 DIAGNOSIS — M2560 Stiffness of unspecified joint, not elsewhere classified: Secondary | ICD-10-CM | POA: Diagnosis not present

## 2022-05-18 DIAGNOSIS — R262 Difficulty in walking, not elsewhere classified: Secondary | ICD-10-CM | POA: Diagnosis not present

## 2022-05-18 DIAGNOSIS — F0153 Vascular dementia, unspecified severity, with mood disturbance: Secondary | ICD-10-CM | POA: Diagnosis not present

## 2022-05-18 DIAGNOSIS — E785 Hyperlipidemia, unspecified: Secondary | ICD-10-CM | POA: Diagnosis not present

## 2022-05-18 DIAGNOSIS — I679 Cerebrovascular disease, unspecified: Secondary | ICD-10-CM | POA: Diagnosis not present

## 2022-05-18 DIAGNOSIS — F330 Major depressive disorder, recurrent, mild: Secondary | ICD-10-CM | POA: Diagnosis not present

## 2022-05-18 DIAGNOSIS — R7309 Other abnormal glucose: Secondary | ICD-10-CM | POA: Diagnosis not present

## 2022-05-18 DIAGNOSIS — I10 Essential (primary) hypertension: Secondary | ICD-10-CM | POA: Diagnosis not present

## 2022-05-19 DIAGNOSIS — R2681 Unsteadiness on feet: Secondary | ICD-10-CM | POA: Diagnosis not present

## 2022-05-19 DIAGNOSIS — M6281 Muscle weakness (generalized): Secondary | ICD-10-CM | POA: Diagnosis not present

## 2022-05-19 DIAGNOSIS — M25651 Stiffness of right hip, not elsewhere classified: Secondary | ICD-10-CM | POA: Diagnosis not present

## 2022-05-19 DIAGNOSIS — M25652 Stiffness of left hip, not elsewhere classified: Secondary | ICD-10-CM | POA: Diagnosis not present

## 2022-05-19 DIAGNOSIS — M2560 Stiffness of unspecified joint, not elsewhere classified: Secondary | ICD-10-CM | POA: Diagnosis not present

## 2022-05-19 DIAGNOSIS — R293 Abnormal posture: Secondary | ICD-10-CM | POA: Diagnosis not present

## 2022-05-24 DIAGNOSIS — R293 Abnormal posture: Secondary | ICD-10-CM | POA: Diagnosis not present

## 2022-05-24 DIAGNOSIS — M6281 Muscle weakness (generalized): Secondary | ICD-10-CM | POA: Diagnosis not present

## 2022-05-24 DIAGNOSIS — M2560 Stiffness of unspecified joint, not elsewhere classified: Secondary | ICD-10-CM | POA: Diagnosis not present

## 2022-05-24 DIAGNOSIS — M25652 Stiffness of left hip, not elsewhere classified: Secondary | ICD-10-CM | POA: Diagnosis not present

## 2022-05-24 DIAGNOSIS — M25651 Stiffness of right hip, not elsewhere classified: Secondary | ICD-10-CM | POA: Diagnosis not present

## 2022-05-24 DIAGNOSIS — R2681 Unsteadiness on feet: Secondary | ICD-10-CM | POA: Diagnosis not present

## 2022-05-26 DIAGNOSIS — R293 Abnormal posture: Secondary | ICD-10-CM | POA: Diagnosis not present

## 2022-05-26 DIAGNOSIS — M25652 Stiffness of left hip, not elsewhere classified: Secondary | ICD-10-CM | POA: Diagnosis not present

## 2022-05-26 DIAGNOSIS — M6281 Muscle weakness (generalized): Secondary | ICD-10-CM | POA: Diagnosis not present

## 2022-05-26 DIAGNOSIS — M25651 Stiffness of right hip, not elsewhere classified: Secondary | ICD-10-CM | POA: Diagnosis not present

## 2022-05-26 DIAGNOSIS — R2681 Unsteadiness on feet: Secondary | ICD-10-CM | POA: Diagnosis not present

## 2022-05-26 DIAGNOSIS — M2560 Stiffness of unspecified joint, not elsewhere classified: Secondary | ICD-10-CM | POA: Diagnosis not present

## 2022-05-29 DIAGNOSIS — M25652 Stiffness of left hip, not elsewhere classified: Secondary | ICD-10-CM | POA: Diagnosis not present

## 2022-05-29 DIAGNOSIS — M2560 Stiffness of unspecified joint, not elsewhere classified: Secondary | ICD-10-CM | POA: Diagnosis not present

## 2022-05-29 DIAGNOSIS — M6281 Muscle weakness (generalized): Secondary | ICD-10-CM | POA: Diagnosis not present

## 2022-05-29 DIAGNOSIS — M25651 Stiffness of right hip, not elsewhere classified: Secondary | ICD-10-CM | POA: Diagnosis not present

## 2022-05-29 DIAGNOSIS — R2681 Unsteadiness on feet: Secondary | ICD-10-CM | POA: Diagnosis not present

## 2022-05-29 DIAGNOSIS — R293 Abnormal posture: Secondary | ICD-10-CM | POA: Diagnosis not present

## 2022-06-01 DIAGNOSIS — M6281 Muscle weakness (generalized): Secondary | ICD-10-CM | POA: Diagnosis not present

## 2022-06-01 DIAGNOSIS — M25651 Stiffness of right hip, not elsewhere classified: Secondary | ICD-10-CM | POA: Diagnosis not present

## 2022-06-01 DIAGNOSIS — R2681 Unsteadiness on feet: Secondary | ICD-10-CM | POA: Diagnosis not present

## 2022-06-01 DIAGNOSIS — R293 Abnormal posture: Secondary | ICD-10-CM | POA: Diagnosis not present

## 2022-06-01 DIAGNOSIS — M2560 Stiffness of unspecified joint, not elsewhere classified: Secondary | ICD-10-CM | POA: Diagnosis not present

## 2022-06-01 DIAGNOSIS — M25652 Stiffness of left hip, not elsewhere classified: Secondary | ICD-10-CM | POA: Diagnosis not present

## 2022-06-05 DIAGNOSIS — M2560 Stiffness of unspecified joint, not elsewhere classified: Secondary | ICD-10-CM | POA: Diagnosis not present

## 2022-06-05 DIAGNOSIS — Z7409 Other reduced mobility: Secondary | ICD-10-CM | POA: Diagnosis not present

## 2022-06-05 DIAGNOSIS — R293 Abnormal posture: Secondary | ICD-10-CM | POA: Diagnosis not present

## 2022-06-05 DIAGNOSIS — R2689 Other abnormalities of gait and mobility: Secondary | ICD-10-CM | POA: Diagnosis not present

## 2022-06-05 DIAGNOSIS — M25651 Stiffness of right hip, not elsewhere classified: Secondary | ICD-10-CM | POA: Diagnosis not present

## 2022-06-05 DIAGNOSIS — M25652 Stiffness of left hip, not elsewhere classified: Secondary | ICD-10-CM | POA: Diagnosis not present

## 2022-06-05 DIAGNOSIS — R482 Apraxia: Secondary | ICD-10-CM | POA: Diagnosis not present

## 2022-06-05 DIAGNOSIS — R2681 Unsteadiness on feet: Secondary | ICD-10-CM | POA: Diagnosis not present

## 2022-07-27 DIAGNOSIS — H40033 Anatomical narrow angle, bilateral: Secondary | ICD-10-CM | POA: Diagnosis not present

## 2022-07-27 DIAGNOSIS — H524 Presbyopia: Secondary | ICD-10-CM | POA: Diagnosis not present

## 2022-07-27 DIAGNOSIS — Z961 Presence of intraocular lens: Secondary | ICD-10-CM | POA: Diagnosis not present

## 2022-08-17 DIAGNOSIS — F330 Major depressive disorder, recurrent, mild: Secondary | ICD-10-CM | POA: Diagnosis not present

## 2022-08-17 DIAGNOSIS — R262 Difficulty in walking, not elsewhere classified: Secondary | ICD-10-CM | POA: Diagnosis not present

## 2022-08-17 DIAGNOSIS — I679 Cerebrovascular disease, unspecified: Secondary | ICD-10-CM | POA: Diagnosis not present

## 2022-08-17 DIAGNOSIS — R569 Unspecified convulsions: Secondary | ICD-10-CM | POA: Diagnosis not present

## 2022-08-17 DIAGNOSIS — F0153 Vascular dementia, unspecified severity, with mood disturbance: Secondary | ICD-10-CM | POA: Diagnosis not present

## 2022-08-17 DIAGNOSIS — R7309 Other abnormal glucose: Secondary | ICD-10-CM | POA: Diagnosis not present

## 2022-08-17 DIAGNOSIS — I10 Essential (primary) hypertension: Secondary | ICD-10-CM | POA: Diagnosis not present

## 2022-08-24 DIAGNOSIS — Z1231 Encounter for screening mammogram for malignant neoplasm of breast: Secondary | ICD-10-CM | POA: Diagnosis not present

## 2022-11-17 DIAGNOSIS — F0153 Vascular dementia, unspecified severity, with mood disturbance: Secondary | ICD-10-CM | POA: Diagnosis not present

## 2022-11-17 DIAGNOSIS — R262 Difficulty in walking, not elsewhere classified: Secondary | ICD-10-CM | POA: Diagnosis not present

## 2022-11-17 DIAGNOSIS — R7309 Other abnormal glucose: Secondary | ICD-10-CM | POA: Diagnosis not present

## 2022-11-17 DIAGNOSIS — R569 Unspecified convulsions: Secondary | ICD-10-CM | POA: Diagnosis not present

## 2022-11-17 DIAGNOSIS — R053 Chronic cough: Secondary | ICD-10-CM | POA: Diagnosis not present

## 2022-11-17 DIAGNOSIS — I679 Cerebrovascular disease, unspecified: Secondary | ICD-10-CM | POA: Diagnosis not present

## 2022-11-17 DIAGNOSIS — Z9181 History of falling: Secondary | ICD-10-CM | POA: Diagnosis not present

## 2022-11-17 DIAGNOSIS — I10 Essential (primary) hypertension: Secondary | ICD-10-CM | POA: Diagnosis not present

## 2022-11-17 DIAGNOSIS — F330 Major depressive disorder, recurrent, mild: Secondary | ICD-10-CM | POA: Diagnosis not present

## 2022-11-17 DIAGNOSIS — E785 Hyperlipidemia, unspecified: Secondary | ICD-10-CM | POA: Diagnosis not present

## 2023-01-31 DIAGNOSIS — H40033 Anatomical narrow angle, bilateral: Secondary | ICD-10-CM | POA: Diagnosis not present

## 2023-02-23 DIAGNOSIS — Z23 Encounter for immunization: Secondary | ICD-10-CM | POA: Diagnosis not present

## 2023-02-23 DIAGNOSIS — E785 Hyperlipidemia, unspecified: Secondary | ICD-10-CM | POA: Diagnosis not present

## 2023-02-23 DIAGNOSIS — I10 Essential (primary) hypertension: Secondary | ICD-10-CM | POA: Diagnosis not present

## 2023-02-23 DIAGNOSIS — Z6833 Body mass index (BMI) 33.0-33.9, adult: Secondary | ICD-10-CM | POA: Diagnosis not present

## 2023-02-23 DIAGNOSIS — R053 Chronic cough: Secondary | ICD-10-CM | POA: Diagnosis not present

## 2023-02-23 DIAGNOSIS — F0153 Vascular dementia, unspecified severity, with mood disturbance: Secondary | ICD-10-CM | POA: Diagnosis not present

## 2023-02-23 DIAGNOSIS — I679 Cerebrovascular disease, unspecified: Secondary | ICD-10-CM | POA: Diagnosis not present

## 2023-02-23 DIAGNOSIS — R7309 Other abnormal glucose: Secondary | ICD-10-CM | POA: Diagnosis not present

## 2023-05-28 DIAGNOSIS — E785 Hyperlipidemia, unspecified: Secondary | ICD-10-CM | POA: Diagnosis not present

## 2023-05-28 DIAGNOSIS — I10 Essential (primary) hypertension: Secondary | ICD-10-CM | POA: Diagnosis not present

## 2023-05-28 DIAGNOSIS — F330 Major depressive disorder, recurrent, mild: Secondary | ICD-10-CM | POA: Diagnosis not present

## 2023-05-28 DIAGNOSIS — I679 Cerebrovascular disease, unspecified: Secondary | ICD-10-CM | POA: Diagnosis not present

## 2023-05-28 DIAGNOSIS — F0153 Vascular dementia, unspecified severity, with mood disturbance: Secondary | ICD-10-CM | POA: Diagnosis not present

## 2023-05-28 DIAGNOSIS — Z741 Need for assistance with personal care: Secondary | ICD-10-CM | POA: Diagnosis not present

## 2023-05-28 DIAGNOSIS — R7309 Other abnormal glucose: Secondary | ICD-10-CM | POA: Diagnosis not present

## 2023-05-28 DIAGNOSIS — R569 Unspecified convulsions: Secondary | ICD-10-CM | POA: Diagnosis not present

## 2023-05-28 DIAGNOSIS — R531 Weakness: Secondary | ICD-10-CM | POA: Diagnosis not present

## 2023-05-28 DIAGNOSIS — R262 Difficulty in walking, not elsewhere classified: Secondary | ICD-10-CM | POA: Diagnosis not present

## 2023-05-31 DIAGNOSIS — Z87891 Personal history of nicotine dependence: Secondary | ICD-10-CM | POA: Diagnosis not present

## 2023-05-31 DIAGNOSIS — I10 Essential (primary) hypertension: Secondary | ICD-10-CM | POA: Diagnosis not present

## 2023-05-31 DIAGNOSIS — E785 Hyperlipidemia, unspecified: Secondary | ICD-10-CM | POA: Diagnosis not present

## 2023-05-31 DIAGNOSIS — J209 Acute bronchitis, unspecified: Secondary | ICD-10-CM | POA: Diagnosis not present

## 2023-05-31 DIAGNOSIS — G40909 Epilepsy, unspecified, not intractable, without status epilepticus: Secondary | ICD-10-CM | POA: Diagnosis not present

## 2023-05-31 DIAGNOSIS — I679 Cerebrovascular disease, unspecified: Secondary | ICD-10-CM | POA: Diagnosis not present

## 2023-05-31 DIAGNOSIS — Z792 Long term (current) use of antibiotics: Secondary | ICD-10-CM | POA: Diagnosis not present

## 2023-05-31 DIAGNOSIS — F330 Major depressive disorder, recurrent, mild: Secondary | ICD-10-CM | POA: Diagnosis not present

## 2023-05-31 DIAGNOSIS — F0153 Vascular dementia, unspecified severity, with mood disturbance: Secondary | ICD-10-CM | POA: Diagnosis not present

## 2023-05-31 DIAGNOSIS — G4700 Insomnia, unspecified: Secondary | ICD-10-CM | POA: Diagnosis not present

## 2023-05-31 DIAGNOSIS — E6689 Other obesity not elsewhere classified: Secondary | ICD-10-CM | POA: Diagnosis not present

## 2023-05-31 DIAGNOSIS — R7309 Other abnormal glucose: Secondary | ICD-10-CM | POA: Diagnosis not present

## 2023-05-31 DIAGNOSIS — M1990 Unspecified osteoarthritis, unspecified site: Secondary | ICD-10-CM | POA: Diagnosis not present

## 2023-05-31 DIAGNOSIS — Z7902 Long term (current) use of antithrombotics/antiplatelets: Secondary | ICD-10-CM | POA: Diagnosis not present

## 2023-05-31 DIAGNOSIS — Z556 Problems related to health literacy: Secondary | ICD-10-CM | POA: Diagnosis not present

## 2023-05-31 DIAGNOSIS — Z79899 Other long term (current) drug therapy: Secondary | ICD-10-CM | POA: Diagnosis not present

## 2023-05-31 DIAGNOSIS — F01518 Vascular dementia, unspecified severity, with other behavioral disturbance: Secondary | ICD-10-CM | POA: Diagnosis not present

## 2023-06-01 DIAGNOSIS — F330 Major depressive disorder, recurrent, mild: Secondary | ICD-10-CM | POA: Diagnosis not present

## 2023-06-01 DIAGNOSIS — F01518 Vascular dementia, unspecified severity, with other behavioral disturbance: Secondary | ICD-10-CM | POA: Diagnosis not present

## 2023-06-01 DIAGNOSIS — G4700 Insomnia, unspecified: Secondary | ICD-10-CM | POA: Diagnosis not present

## 2023-06-01 DIAGNOSIS — G40909 Epilepsy, unspecified, not intractable, without status epilepticus: Secondary | ICD-10-CM | POA: Diagnosis not present

## 2023-06-01 DIAGNOSIS — I10 Essential (primary) hypertension: Secondary | ICD-10-CM | POA: Diagnosis not present

## 2023-06-01 DIAGNOSIS — F0153 Vascular dementia, unspecified severity, with mood disturbance: Secondary | ICD-10-CM | POA: Diagnosis not present

## 2023-06-04 DIAGNOSIS — G4700 Insomnia, unspecified: Secondary | ICD-10-CM | POA: Diagnosis not present

## 2023-06-04 DIAGNOSIS — F330 Major depressive disorder, recurrent, mild: Secondary | ICD-10-CM | POA: Diagnosis not present

## 2023-06-04 DIAGNOSIS — F0153 Vascular dementia, unspecified severity, with mood disturbance: Secondary | ICD-10-CM | POA: Diagnosis not present

## 2023-06-04 DIAGNOSIS — F01518 Vascular dementia, unspecified severity, with other behavioral disturbance: Secondary | ICD-10-CM | POA: Diagnosis not present

## 2023-06-04 DIAGNOSIS — G40909 Epilepsy, unspecified, not intractable, without status epilepticus: Secondary | ICD-10-CM | POA: Diagnosis not present

## 2023-06-04 DIAGNOSIS — I10 Essential (primary) hypertension: Secondary | ICD-10-CM | POA: Diagnosis not present

## 2023-06-05 DIAGNOSIS — F0153 Vascular dementia, unspecified severity, with mood disturbance: Secondary | ICD-10-CM | POA: Diagnosis not present

## 2023-06-05 DIAGNOSIS — I10 Essential (primary) hypertension: Secondary | ICD-10-CM | POA: Diagnosis not present

## 2023-06-05 DIAGNOSIS — G4700 Insomnia, unspecified: Secondary | ICD-10-CM | POA: Diagnosis not present

## 2023-06-05 DIAGNOSIS — F330 Major depressive disorder, recurrent, mild: Secondary | ICD-10-CM | POA: Diagnosis not present

## 2023-06-05 DIAGNOSIS — G40909 Epilepsy, unspecified, not intractable, without status epilepticus: Secondary | ICD-10-CM | POA: Diagnosis not present

## 2023-06-05 DIAGNOSIS — F01518 Vascular dementia, unspecified severity, with other behavioral disturbance: Secondary | ICD-10-CM | POA: Diagnosis not present

## 2023-06-06 DIAGNOSIS — F01518 Vascular dementia, unspecified severity, with other behavioral disturbance: Secondary | ICD-10-CM | POA: Diagnosis not present

## 2023-06-06 DIAGNOSIS — G40909 Epilepsy, unspecified, not intractable, without status epilepticus: Secondary | ICD-10-CM | POA: Diagnosis not present

## 2023-06-06 DIAGNOSIS — F330 Major depressive disorder, recurrent, mild: Secondary | ICD-10-CM | POA: Diagnosis not present

## 2023-06-06 DIAGNOSIS — F0153 Vascular dementia, unspecified severity, with mood disturbance: Secondary | ICD-10-CM | POA: Diagnosis not present

## 2023-06-06 DIAGNOSIS — G4700 Insomnia, unspecified: Secondary | ICD-10-CM | POA: Diagnosis not present

## 2023-06-06 DIAGNOSIS — I10 Essential (primary) hypertension: Secondary | ICD-10-CM | POA: Diagnosis not present

## 2023-06-07 DIAGNOSIS — G4700 Insomnia, unspecified: Secondary | ICD-10-CM | POA: Diagnosis not present

## 2023-06-07 DIAGNOSIS — G40909 Epilepsy, unspecified, not intractable, without status epilepticus: Secondary | ICD-10-CM | POA: Diagnosis not present

## 2023-06-07 DIAGNOSIS — F0153 Vascular dementia, unspecified severity, with mood disturbance: Secondary | ICD-10-CM | POA: Diagnosis not present

## 2023-06-07 DIAGNOSIS — F330 Major depressive disorder, recurrent, mild: Secondary | ICD-10-CM | POA: Diagnosis not present

## 2023-06-07 DIAGNOSIS — F01518 Vascular dementia, unspecified severity, with other behavioral disturbance: Secondary | ICD-10-CM | POA: Diagnosis not present

## 2023-06-07 DIAGNOSIS — I10 Essential (primary) hypertension: Secondary | ICD-10-CM | POA: Diagnosis not present

## 2023-06-08 DIAGNOSIS — I10 Essential (primary) hypertension: Secondary | ICD-10-CM | POA: Diagnosis not present

## 2023-06-08 DIAGNOSIS — F330 Major depressive disorder, recurrent, mild: Secondary | ICD-10-CM | POA: Diagnosis not present

## 2023-06-08 DIAGNOSIS — F0153 Vascular dementia, unspecified severity, with mood disturbance: Secondary | ICD-10-CM | POA: Diagnosis not present

## 2023-06-08 DIAGNOSIS — G4700 Insomnia, unspecified: Secondary | ICD-10-CM | POA: Diagnosis not present

## 2023-06-08 DIAGNOSIS — F01518 Vascular dementia, unspecified severity, with other behavioral disturbance: Secondary | ICD-10-CM | POA: Diagnosis not present

## 2023-06-08 DIAGNOSIS — G40909 Epilepsy, unspecified, not intractable, without status epilepticus: Secondary | ICD-10-CM | POA: Diagnosis not present

## 2023-06-11 DIAGNOSIS — G4700 Insomnia, unspecified: Secondary | ICD-10-CM | POA: Diagnosis not present

## 2023-06-11 DIAGNOSIS — G40909 Epilepsy, unspecified, not intractable, without status epilepticus: Secondary | ICD-10-CM | POA: Diagnosis not present

## 2023-06-11 DIAGNOSIS — F01518 Vascular dementia, unspecified severity, with other behavioral disturbance: Secondary | ICD-10-CM | POA: Diagnosis not present

## 2023-06-11 DIAGNOSIS — F330 Major depressive disorder, recurrent, mild: Secondary | ICD-10-CM | POA: Diagnosis not present

## 2023-06-11 DIAGNOSIS — F0153 Vascular dementia, unspecified severity, with mood disturbance: Secondary | ICD-10-CM | POA: Diagnosis not present

## 2023-06-11 DIAGNOSIS — I10 Essential (primary) hypertension: Secondary | ICD-10-CM | POA: Diagnosis not present

## 2023-06-12 DIAGNOSIS — G40909 Epilepsy, unspecified, not intractable, without status epilepticus: Secondary | ICD-10-CM | POA: Diagnosis not present

## 2023-06-12 DIAGNOSIS — F01518 Vascular dementia, unspecified severity, with other behavioral disturbance: Secondary | ICD-10-CM | POA: Diagnosis not present

## 2023-06-12 DIAGNOSIS — F0153 Vascular dementia, unspecified severity, with mood disturbance: Secondary | ICD-10-CM | POA: Diagnosis not present

## 2023-06-12 DIAGNOSIS — F330 Major depressive disorder, recurrent, mild: Secondary | ICD-10-CM | POA: Diagnosis not present

## 2023-06-12 DIAGNOSIS — I10 Essential (primary) hypertension: Secondary | ICD-10-CM | POA: Diagnosis not present

## 2023-06-12 DIAGNOSIS — G4700 Insomnia, unspecified: Secondary | ICD-10-CM | POA: Diagnosis not present

## 2023-06-13 DIAGNOSIS — I10 Essential (primary) hypertension: Secondary | ICD-10-CM | POA: Diagnosis not present

## 2023-06-13 DIAGNOSIS — F01518 Vascular dementia, unspecified severity, with other behavioral disturbance: Secondary | ICD-10-CM | POA: Diagnosis not present

## 2023-06-13 DIAGNOSIS — G40909 Epilepsy, unspecified, not intractable, without status epilepticus: Secondary | ICD-10-CM | POA: Diagnosis not present

## 2023-06-13 DIAGNOSIS — F330 Major depressive disorder, recurrent, mild: Secondary | ICD-10-CM | POA: Diagnosis not present

## 2023-06-13 DIAGNOSIS — G4700 Insomnia, unspecified: Secondary | ICD-10-CM | POA: Diagnosis not present

## 2023-06-13 DIAGNOSIS — F0153 Vascular dementia, unspecified severity, with mood disturbance: Secondary | ICD-10-CM | POA: Diagnosis not present

## 2023-06-14 DIAGNOSIS — F0153 Vascular dementia, unspecified severity, with mood disturbance: Secondary | ICD-10-CM | POA: Diagnosis not present

## 2023-06-14 DIAGNOSIS — G4700 Insomnia, unspecified: Secondary | ICD-10-CM | POA: Diagnosis not present

## 2023-06-14 DIAGNOSIS — G40909 Epilepsy, unspecified, not intractable, without status epilepticus: Secondary | ICD-10-CM | POA: Diagnosis not present

## 2023-06-14 DIAGNOSIS — F330 Major depressive disorder, recurrent, mild: Secondary | ICD-10-CM | POA: Diagnosis not present

## 2023-06-14 DIAGNOSIS — F01518 Vascular dementia, unspecified severity, with other behavioral disturbance: Secondary | ICD-10-CM | POA: Diagnosis not present

## 2023-06-14 DIAGNOSIS — I10 Essential (primary) hypertension: Secondary | ICD-10-CM | POA: Diagnosis not present

## 2023-06-15 DIAGNOSIS — G4700 Insomnia, unspecified: Secondary | ICD-10-CM | POA: Diagnosis not present

## 2023-06-15 DIAGNOSIS — F01518 Vascular dementia, unspecified severity, with other behavioral disturbance: Secondary | ICD-10-CM | POA: Diagnosis not present

## 2023-06-15 DIAGNOSIS — F0153 Vascular dementia, unspecified severity, with mood disturbance: Secondary | ICD-10-CM | POA: Diagnosis not present

## 2023-06-15 DIAGNOSIS — G40909 Epilepsy, unspecified, not intractable, without status epilepticus: Secondary | ICD-10-CM | POA: Diagnosis not present

## 2023-06-15 DIAGNOSIS — F330 Major depressive disorder, recurrent, mild: Secondary | ICD-10-CM | POA: Diagnosis not present

## 2023-06-15 DIAGNOSIS — I10 Essential (primary) hypertension: Secondary | ICD-10-CM | POA: Diagnosis not present

## 2023-06-18 DIAGNOSIS — F01518 Vascular dementia, unspecified severity, with other behavioral disturbance: Secondary | ICD-10-CM | POA: Diagnosis not present

## 2023-06-18 DIAGNOSIS — F330 Major depressive disorder, recurrent, mild: Secondary | ICD-10-CM | POA: Diagnosis not present

## 2023-06-18 DIAGNOSIS — F0153 Vascular dementia, unspecified severity, with mood disturbance: Secondary | ICD-10-CM | POA: Diagnosis not present

## 2023-06-18 DIAGNOSIS — I10 Essential (primary) hypertension: Secondary | ICD-10-CM | POA: Diagnosis not present

## 2023-06-18 DIAGNOSIS — G4700 Insomnia, unspecified: Secondary | ICD-10-CM | POA: Diagnosis not present

## 2023-06-18 DIAGNOSIS — G40909 Epilepsy, unspecified, not intractable, without status epilepticus: Secondary | ICD-10-CM | POA: Diagnosis not present

## 2023-06-19 DIAGNOSIS — G4700 Insomnia, unspecified: Secondary | ICD-10-CM | POA: Diagnosis not present

## 2023-06-19 DIAGNOSIS — F330 Major depressive disorder, recurrent, mild: Secondary | ICD-10-CM | POA: Diagnosis not present

## 2023-06-19 DIAGNOSIS — F01518 Vascular dementia, unspecified severity, with other behavioral disturbance: Secondary | ICD-10-CM | POA: Diagnosis not present

## 2023-06-19 DIAGNOSIS — F0153 Vascular dementia, unspecified severity, with mood disturbance: Secondary | ICD-10-CM | POA: Diagnosis not present

## 2023-06-19 DIAGNOSIS — G40909 Epilepsy, unspecified, not intractable, without status epilepticus: Secondary | ICD-10-CM | POA: Diagnosis not present

## 2023-06-19 DIAGNOSIS — I10 Essential (primary) hypertension: Secondary | ICD-10-CM | POA: Diagnosis not present

## 2023-06-20 DIAGNOSIS — G4700 Insomnia, unspecified: Secondary | ICD-10-CM | POA: Diagnosis not present

## 2023-06-20 DIAGNOSIS — G40909 Epilepsy, unspecified, not intractable, without status epilepticus: Secondary | ICD-10-CM | POA: Diagnosis not present

## 2023-06-20 DIAGNOSIS — F01518 Vascular dementia, unspecified severity, with other behavioral disturbance: Secondary | ICD-10-CM | POA: Diagnosis not present

## 2023-06-20 DIAGNOSIS — F330 Major depressive disorder, recurrent, mild: Secondary | ICD-10-CM | POA: Diagnosis not present

## 2023-06-20 DIAGNOSIS — F0153 Vascular dementia, unspecified severity, with mood disturbance: Secondary | ICD-10-CM | POA: Diagnosis not present

## 2023-06-20 DIAGNOSIS — I10 Essential (primary) hypertension: Secondary | ICD-10-CM | POA: Diagnosis not present

## 2023-06-21 DIAGNOSIS — G4700 Insomnia, unspecified: Secondary | ICD-10-CM | POA: Diagnosis not present

## 2023-06-21 DIAGNOSIS — I10 Essential (primary) hypertension: Secondary | ICD-10-CM | POA: Diagnosis not present

## 2023-06-21 DIAGNOSIS — F0153 Vascular dementia, unspecified severity, with mood disturbance: Secondary | ICD-10-CM | POA: Diagnosis not present

## 2023-06-21 DIAGNOSIS — F330 Major depressive disorder, recurrent, mild: Secondary | ICD-10-CM | POA: Diagnosis not present

## 2023-06-21 DIAGNOSIS — F01518 Vascular dementia, unspecified severity, with other behavioral disturbance: Secondary | ICD-10-CM | POA: Diagnosis not present

## 2023-06-21 DIAGNOSIS — G40909 Epilepsy, unspecified, not intractable, without status epilepticus: Secondary | ICD-10-CM | POA: Diagnosis not present

## 2023-06-25 DIAGNOSIS — F0153 Vascular dementia, unspecified severity, with mood disturbance: Secondary | ICD-10-CM | POA: Diagnosis not present

## 2023-06-25 DIAGNOSIS — F330 Major depressive disorder, recurrent, mild: Secondary | ICD-10-CM | POA: Diagnosis not present

## 2023-06-25 DIAGNOSIS — G4700 Insomnia, unspecified: Secondary | ICD-10-CM | POA: Diagnosis not present

## 2023-06-25 DIAGNOSIS — F01518 Vascular dementia, unspecified severity, with other behavioral disturbance: Secondary | ICD-10-CM | POA: Diagnosis not present

## 2023-06-25 DIAGNOSIS — G40909 Epilepsy, unspecified, not intractable, without status epilepticus: Secondary | ICD-10-CM | POA: Diagnosis not present

## 2023-06-25 DIAGNOSIS — I10 Essential (primary) hypertension: Secondary | ICD-10-CM | POA: Diagnosis not present

## 2023-06-26 DIAGNOSIS — F01518 Vascular dementia, unspecified severity, with other behavioral disturbance: Secondary | ICD-10-CM | POA: Diagnosis not present

## 2023-06-26 DIAGNOSIS — I10 Essential (primary) hypertension: Secondary | ICD-10-CM | POA: Diagnosis not present

## 2023-06-26 DIAGNOSIS — G4700 Insomnia, unspecified: Secondary | ICD-10-CM | POA: Diagnosis not present

## 2023-06-26 DIAGNOSIS — F330 Major depressive disorder, recurrent, mild: Secondary | ICD-10-CM | POA: Diagnosis not present

## 2023-06-26 DIAGNOSIS — F0153 Vascular dementia, unspecified severity, with mood disturbance: Secondary | ICD-10-CM | POA: Diagnosis not present

## 2023-06-26 DIAGNOSIS — G40909 Epilepsy, unspecified, not intractable, without status epilepticus: Secondary | ICD-10-CM | POA: Diagnosis not present

## 2023-06-28 DIAGNOSIS — F01518 Vascular dementia, unspecified severity, with other behavioral disturbance: Secondary | ICD-10-CM | POA: Diagnosis not present

## 2023-06-28 DIAGNOSIS — F330 Major depressive disorder, recurrent, mild: Secondary | ICD-10-CM | POA: Diagnosis not present

## 2023-06-28 DIAGNOSIS — G40909 Epilepsy, unspecified, not intractable, without status epilepticus: Secondary | ICD-10-CM | POA: Diagnosis not present

## 2023-06-28 DIAGNOSIS — G4700 Insomnia, unspecified: Secondary | ICD-10-CM | POA: Diagnosis not present

## 2023-06-28 DIAGNOSIS — F0153 Vascular dementia, unspecified severity, with mood disturbance: Secondary | ICD-10-CM | POA: Diagnosis not present

## 2023-06-28 DIAGNOSIS — I10 Essential (primary) hypertension: Secondary | ICD-10-CM | POA: Diagnosis not present

## 2023-06-29 DIAGNOSIS — G4700 Insomnia, unspecified: Secondary | ICD-10-CM | POA: Diagnosis not present

## 2023-06-29 DIAGNOSIS — F330 Major depressive disorder, recurrent, mild: Secondary | ICD-10-CM | POA: Diagnosis not present

## 2023-06-29 DIAGNOSIS — G40909 Epilepsy, unspecified, not intractable, without status epilepticus: Secondary | ICD-10-CM | POA: Diagnosis not present

## 2023-06-29 DIAGNOSIS — F0153 Vascular dementia, unspecified severity, with mood disturbance: Secondary | ICD-10-CM | POA: Diagnosis not present

## 2023-06-29 DIAGNOSIS — F01518 Vascular dementia, unspecified severity, with other behavioral disturbance: Secondary | ICD-10-CM | POA: Diagnosis not present

## 2023-06-29 DIAGNOSIS — I10 Essential (primary) hypertension: Secondary | ICD-10-CM | POA: Diagnosis not present

## 2023-06-30 DIAGNOSIS — Z7902 Long term (current) use of antithrombotics/antiplatelets: Secondary | ICD-10-CM | POA: Diagnosis not present

## 2023-06-30 DIAGNOSIS — E6689 Other obesity not elsewhere classified: Secondary | ICD-10-CM | POA: Diagnosis not present

## 2023-06-30 DIAGNOSIS — I10 Essential (primary) hypertension: Secondary | ICD-10-CM | POA: Diagnosis not present

## 2023-06-30 DIAGNOSIS — Z79899 Other long term (current) drug therapy: Secondary | ICD-10-CM | POA: Diagnosis not present

## 2023-06-30 DIAGNOSIS — Z556 Problems related to health literacy: Secondary | ICD-10-CM | POA: Diagnosis not present

## 2023-06-30 DIAGNOSIS — I679 Cerebrovascular disease, unspecified: Secondary | ICD-10-CM | POA: Diagnosis not present

## 2023-06-30 DIAGNOSIS — E785 Hyperlipidemia, unspecified: Secondary | ICD-10-CM | POA: Diagnosis not present

## 2023-06-30 DIAGNOSIS — F330 Major depressive disorder, recurrent, mild: Secondary | ICD-10-CM | POA: Diagnosis not present

## 2023-06-30 DIAGNOSIS — J209 Acute bronchitis, unspecified: Secondary | ICD-10-CM | POA: Diagnosis not present

## 2023-06-30 DIAGNOSIS — F01518 Vascular dementia, unspecified severity, with other behavioral disturbance: Secondary | ICD-10-CM | POA: Diagnosis not present

## 2023-06-30 DIAGNOSIS — M1990 Unspecified osteoarthritis, unspecified site: Secondary | ICD-10-CM | POA: Diagnosis not present

## 2023-06-30 DIAGNOSIS — Z87891 Personal history of nicotine dependence: Secondary | ICD-10-CM | POA: Diagnosis not present

## 2023-06-30 DIAGNOSIS — Z792 Long term (current) use of antibiotics: Secondary | ICD-10-CM | POA: Diagnosis not present

## 2023-06-30 DIAGNOSIS — G40909 Epilepsy, unspecified, not intractable, without status epilepticus: Secondary | ICD-10-CM | POA: Diagnosis not present

## 2023-06-30 DIAGNOSIS — G4700 Insomnia, unspecified: Secondary | ICD-10-CM | POA: Diagnosis not present

## 2023-06-30 DIAGNOSIS — R7309 Other abnormal glucose: Secondary | ICD-10-CM | POA: Diagnosis not present

## 2023-06-30 DIAGNOSIS — F0153 Vascular dementia, unspecified severity, with mood disturbance: Secondary | ICD-10-CM | POA: Diagnosis not present

## 2023-07-03 DIAGNOSIS — F0153 Vascular dementia, unspecified severity, with mood disturbance: Secondary | ICD-10-CM | POA: Diagnosis not present

## 2023-07-03 DIAGNOSIS — I10 Essential (primary) hypertension: Secondary | ICD-10-CM | POA: Diagnosis not present

## 2023-07-03 DIAGNOSIS — F01518 Vascular dementia, unspecified severity, with other behavioral disturbance: Secondary | ICD-10-CM | POA: Diagnosis not present

## 2023-07-03 DIAGNOSIS — F330 Major depressive disorder, recurrent, mild: Secondary | ICD-10-CM | POA: Diagnosis not present

## 2023-07-03 DIAGNOSIS — G4700 Insomnia, unspecified: Secondary | ICD-10-CM | POA: Diagnosis not present

## 2023-07-03 DIAGNOSIS — G40909 Epilepsy, unspecified, not intractable, without status epilepticus: Secondary | ICD-10-CM | POA: Diagnosis not present

## 2023-07-04 DIAGNOSIS — I10 Essential (primary) hypertension: Secondary | ICD-10-CM | POA: Diagnosis not present

## 2023-07-04 DIAGNOSIS — G40909 Epilepsy, unspecified, not intractable, without status epilepticus: Secondary | ICD-10-CM | POA: Diagnosis not present

## 2023-07-04 DIAGNOSIS — F0153 Vascular dementia, unspecified severity, with mood disturbance: Secondary | ICD-10-CM | POA: Diagnosis not present

## 2023-07-04 DIAGNOSIS — G4700 Insomnia, unspecified: Secondary | ICD-10-CM | POA: Diagnosis not present

## 2023-07-04 DIAGNOSIS — F330 Major depressive disorder, recurrent, mild: Secondary | ICD-10-CM | POA: Diagnosis not present

## 2023-07-04 DIAGNOSIS — F01518 Vascular dementia, unspecified severity, with other behavioral disturbance: Secondary | ICD-10-CM | POA: Diagnosis not present

## 2023-07-05 DIAGNOSIS — I10 Essential (primary) hypertension: Secondary | ICD-10-CM | POA: Diagnosis not present

## 2023-07-05 DIAGNOSIS — G4700 Insomnia, unspecified: Secondary | ICD-10-CM | POA: Diagnosis not present

## 2023-07-05 DIAGNOSIS — F01518 Vascular dementia, unspecified severity, with other behavioral disturbance: Secondary | ICD-10-CM | POA: Diagnosis not present

## 2023-07-05 DIAGNOSIS — F330 Major depressive disorder, recurrent, mild: Secondary | ICD-10-CM | POA: Diagnosis not present

## 2023-07-05 DIAGNOSIS — F0153 Vascular dementia, unspecified severity, with mood disturbance: Secondary | ICD-10-CM | POA: Diagnosis not present

## 2023-07-05 DIAGNOSIS — G40909 Epilepsy, unspecified, not intractable, without status epilepticus: Secondary | ICD-10-CM | POA: Diagnosis not present

## 2023-07-10 DIAGNOSIS — F0153 Vascular dementia, unspecified severity, with mood disturbance: Secondary | ICD-10-CM | POA: Diagnosis not present

## 2023-07-10 DIAGNOSIS — G40909 Epilepsy, unspecified, not intractable, without status epilepticus: Secondary | ICD-10-CM | POA: Diagnosis not present

## 2023-07-10 DIAGNOSIS — F01518 Vascular dementia, unspecified severity, with other behavioral disturbance: Secondary | ICD-10-CM | POA: Diagnosis not present

## 2023-07-10 DIAGNOSIS — I10 Essential (primary) hypertension: Secondary | ICD-10-CM | POA: Diagnosis not present

## 2023-07-10 DIAGNOSIS — F330 Major depressive disorder, recurrent, mild: Secondary | ICD-10-CM | POA: Diagnosis not present

## 2023-07-10 DIAGNOSIS — G4700 Insomnia, unspecified: Secondary | ICD-10-CM | POA: Diagnosis not present

## 2023-07-13 DIAGNOSIS — F330 Major depressive disorder, recurrent, mild: Secondary | ICD-10-CM | POA: Diagnosis not present

## 2023-07-13 DIAGNOSIS — F0153 Vascular dementia, unspecified severity, with mood disturbance: Secondary | ICD-10-CM | POA: Diagnosis not present

## 2023-07-13 DIAGNOSIS — F01518 Vascular dementia, unspecified severity, with other behavioral disturbance: Secondary | ICD-10-CM | POA: Diagnosis not present

## 2023-07-13 DIAGNOSIS — G40909 Epilepsy, unspecified, not intractable, without status epilepticus: Secondary | ICD-10-CM | POA: Diagnosis not present

## 2023-07-13 DIAGNOSIS — G4700 Insomnia, unspecified: Secondary | ICD-10-CM | POA: Diagnosis not present

## 2023-07-13 DIAGNOSIS — I10 Essential (primary) hypertension: Secondary | ICD-10-CM | POA: Diagnosis not present

## 2023-07-17 DIAGNOSIS — G40909 Epilepsy, unspecified, not intractable, without status epilepticus: Secondary | ICD-10-CM | POA: Diagnosis not present

## 2023-07-17 DIAGNOSIS — G4700 Insomnia, unspecified: Secondary | ICD-10-CM | POA: Diagnosis not present

## 2023-07-17 DIAGNOSIS — F0153 Vascular dementia, unspecified severity, with mood disturbance: Secondary | ICD-10-CM | POA: Diagnosis not present

## 2023-07-17 DIAGNOSIS — F330 Major depressive disorder, recurrent, mild: Secondary | ICD-10-CM | POA: Diagnosis not present

## 2023-07-17 DIAGNOSIS — F01518 Vascular dementia, unspecified severity, with other behavioral disturbance: Secondary | ICD-10-CM | POA: Diagnosis not present

## 2023-07-17 DIAGNOSIS — I10 Essential (primary) hypertension: Secondary | ICD-10-CM | POA: Diagnosis not present

## 2023-07-17 IMAGING — MG MM BREAST BX W/ LOC DEV 1ST LESION IMAGE BX SPEC STEREO GUIDE*R*
8 of 19 series · 8 of 40 positions shown · non-contrast
Comparison: None Available.
COMPARISON: None Available.

Addendum:
CLINICAL DATA: 76-year-old female for tissue sampling of 0.6 cm
UPPER-OUTER RIGHT breast asymmetry

EXAM:
RIGHT BREAST STEREOTACTIC CORE NEEDLE BIOPSY

[R (1 of 8)]
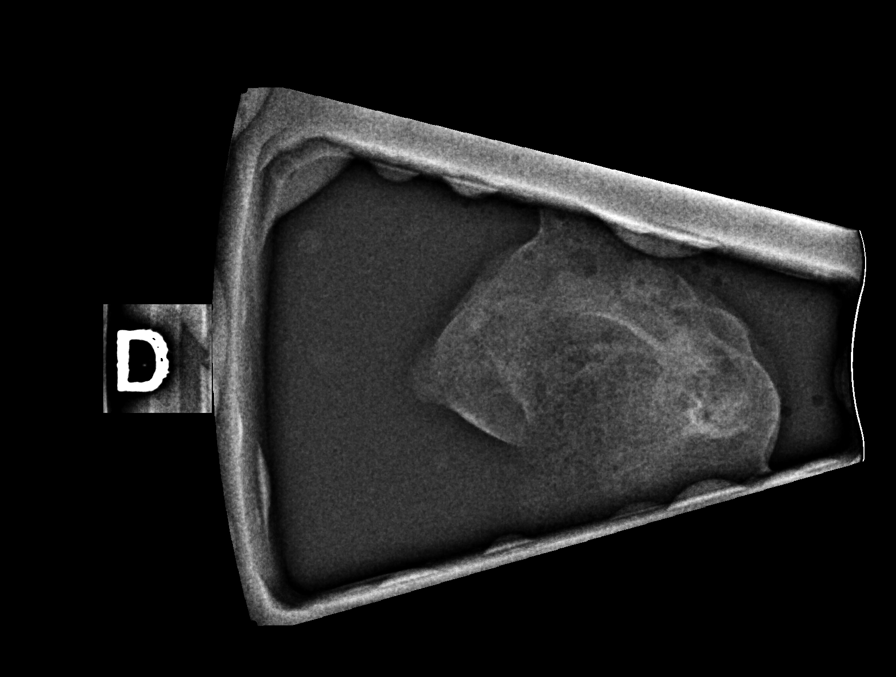

[R (2 of 8)]
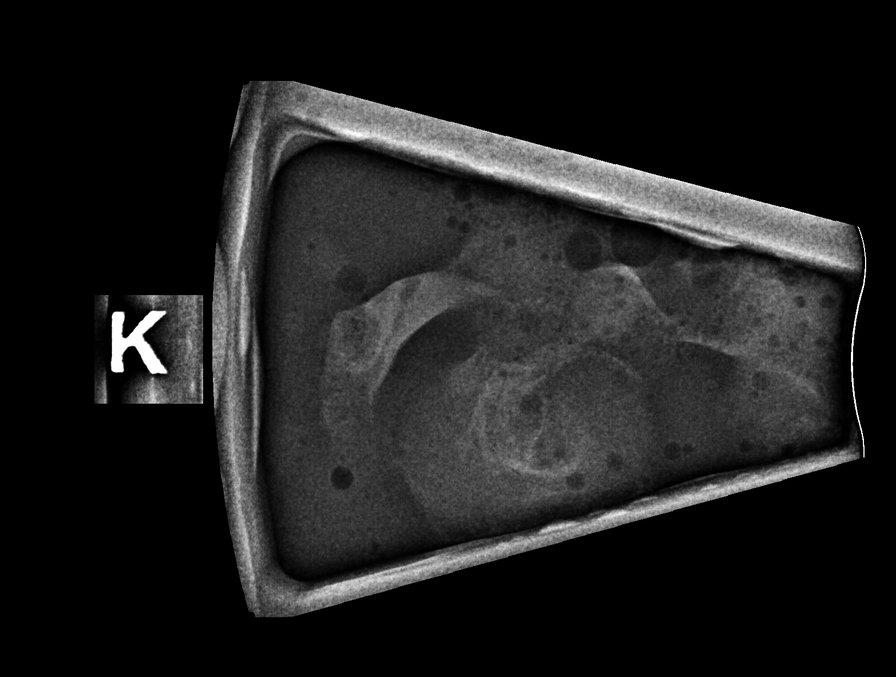

[R (3 of 8)]
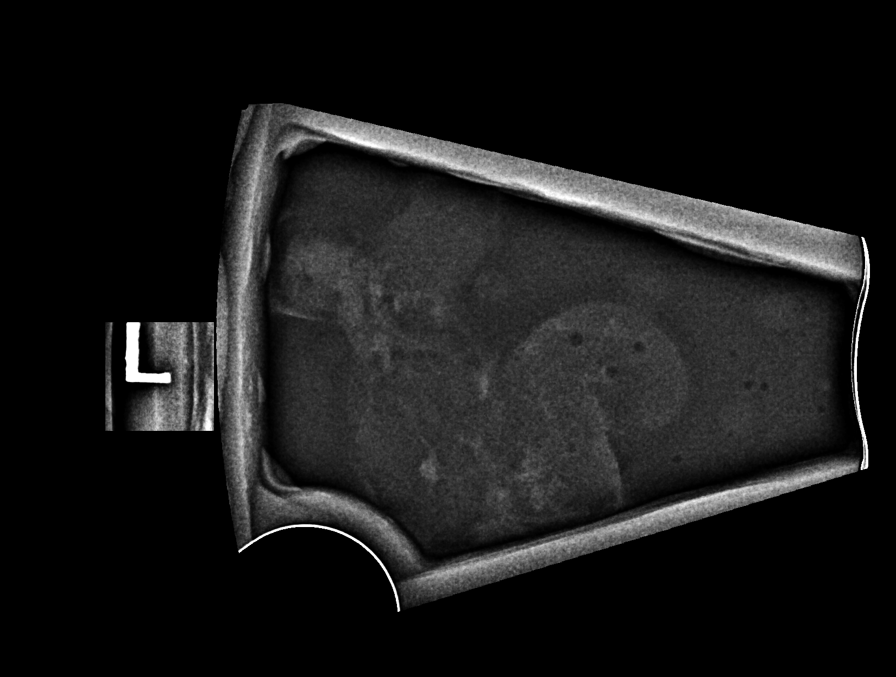

[R (4 of 8)]
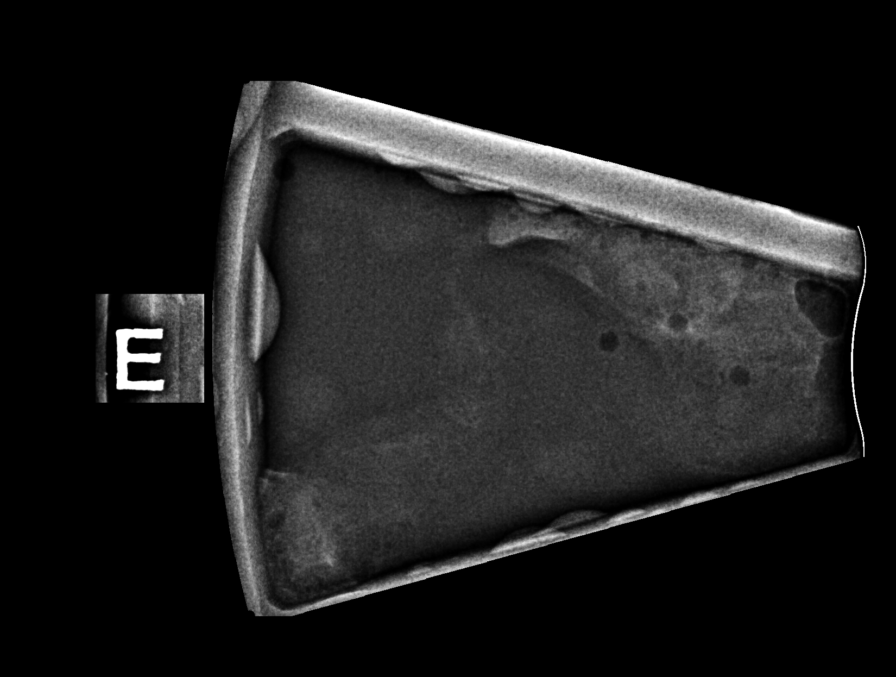

[R (5 of 8)]
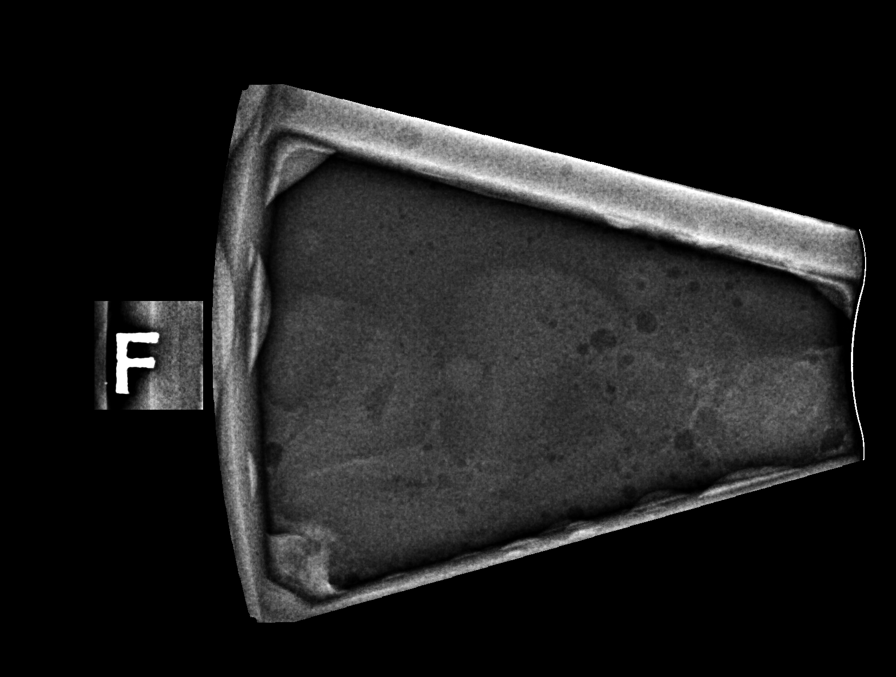

[R (6 of 8)]
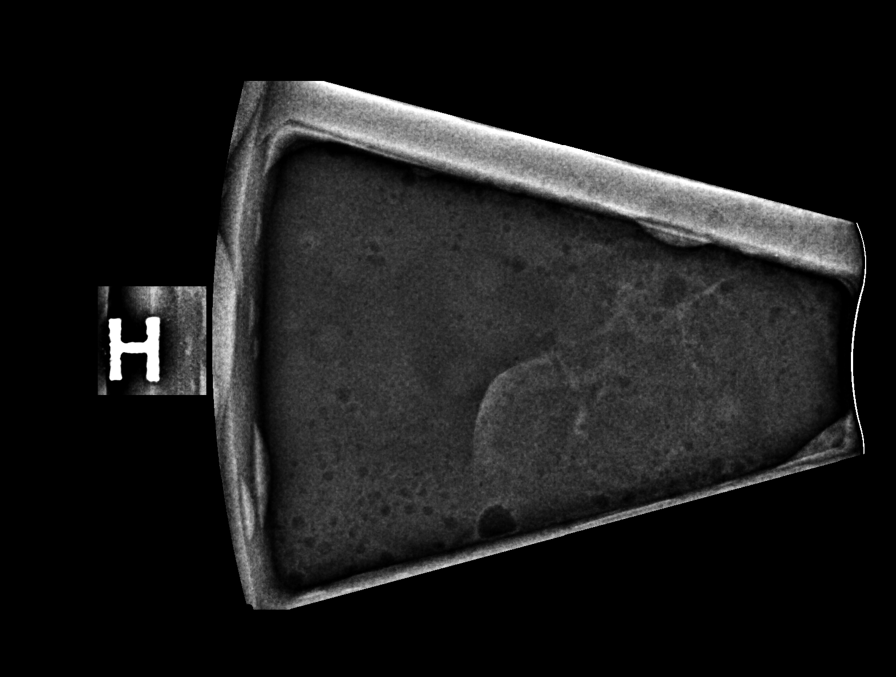

[R (7 of 8)]
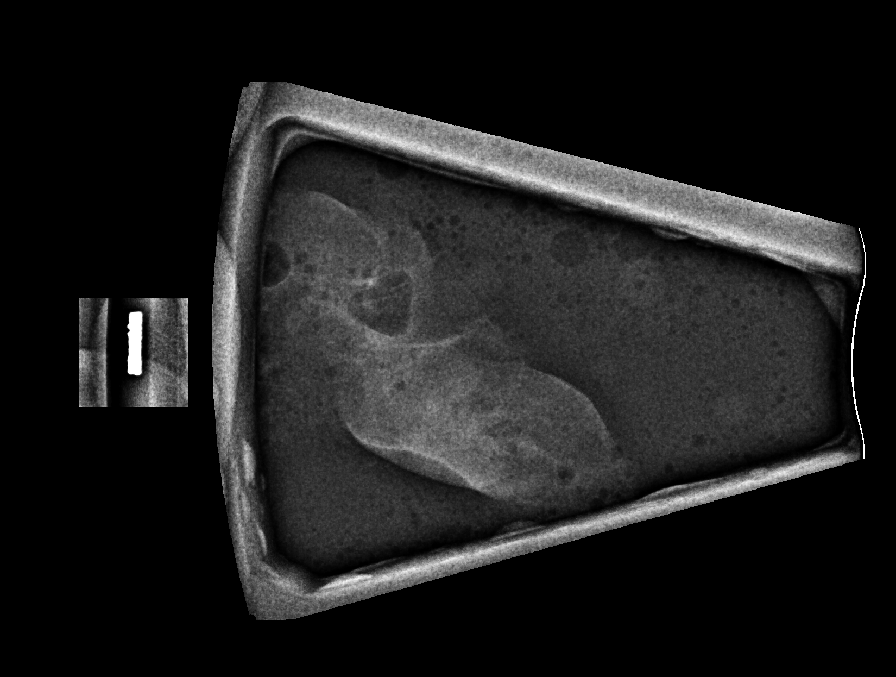

[R (8 of 8)]
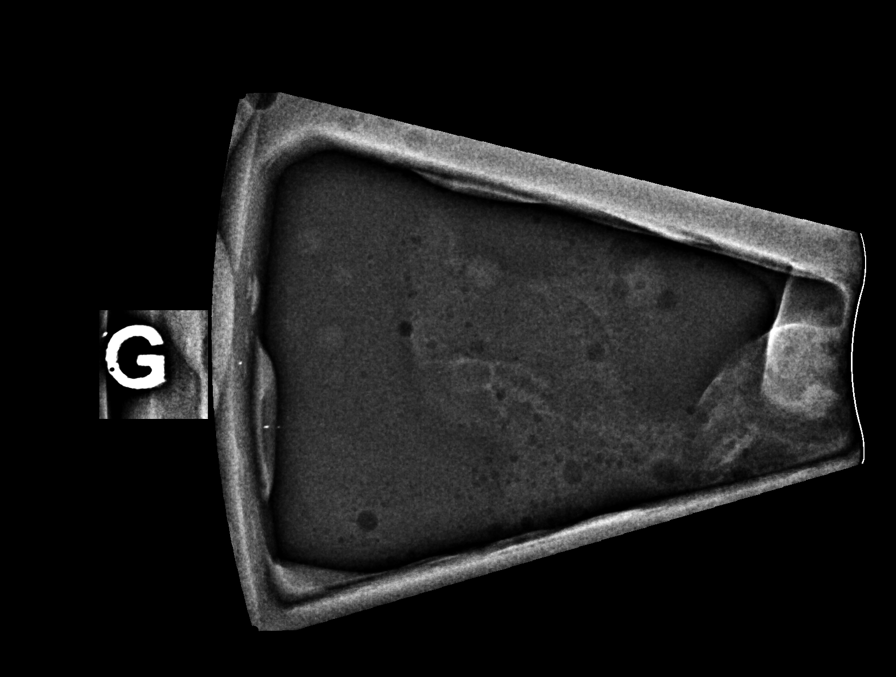

[8 of 40 positions shown; findings below may reference images not displayed]



Using sterile technique and 1% Lidocaine as local anesthetic, under
stereotactic guidance, a 9 gauge vacuum assisted device was used to
perform core needle biopsy of the 0.6 cm asymmetry within the
posterior UPPER OUTER RIGHT breast using a LATERAL approach.
Specimen radiograph was performed.

Lesion quadrant: UPPER-OUTER RIGHT breast

At the conclusion of the procedure, a RIBBON shaped tissue marker
clip was deployed into the biopsy cavity. Follow-up 2-view mammogram
was performed and dictated separately.
IMPRESSION: Stereotactic-guided biopsy of 0.6 cm UPPER-OUTER RIGHT breast
asymmetry. No apparent complications.

ADDENDUM:
Pathology revealed BENIGN BREAST TISSUE WITH GRANULOMATOUS AND
NEUTROPHILIC INFLAMMATION WITH ASSOCIATED ADJACENT FAT NECROSIS of
the RIGHT breast, upper outer, (ribbon clip). (MENG, JEWGENI, [employer] AND
GRAM STAINS ARE PENDING AND WILL BE REPORTED IN AN ADDENDUM). This
was found to be concordant by Dr. Jhemboy Padam.

Pathology results were discussed with the patient's husband, Jerel
Cualicion Respuesta Rapida by telephone, per request. Mr. Cualicion Respuesta Rapida reported his wife did
well after the biopsy with minimal tenderness at the site. Post
biopsy instructions and care were reviewed and questions were
answered. The patient's husband was encouraged to call The [REDACTED] for any additional concerns. My direct
phone number was provided.

Mr. Cualicion Respuesta Rapida was encouraged to have his wife contact her provider if
any clinical signs and/or symptoms of infection occur.

The patient was instructed to return for annual screening
mammography at [facility] in [HOSPITAL[facility]HOSPITAL].

Pathology results reported by Tj Suriel, RN on 08/23/2021.



Using sterile technique and 1% Lidocaine as local anesthetic, under
stereotactic guidance, a 9 gauge vacuum assisted device was used to
perform core needle biopsy of the 0.6 cm asymmetry within the
posterior UPPER OUTER RIGHT breast using a LATERAL approach.
Specimen radiograph was performed.

Lesion quadrant: UPPER-OUTER RIGHT breast

At the conclusion of the procedure, a RIBBON shaped tissue marker
clip was deployed into the biopsy cavity. Follow-up 2-view mammogram
was performed and dictated separately.
IMPRESSION: Stereotactic-guided biopsy of 0.6 cm UPPER-OUTER RIGHT breast
asymmetry. No apparent complications.

## 2023-07-18 DIAGNOSIS — G40909 Epilepsy, unspecified, not intractable, without status epilepticus: Secondary | ICD-10-CM | POA: Diagnosis not present

## 2023-07-18 DIAGNOSIS — F01518 Vascular dementia, unspecified severity, with other behavioral disturbance: Secondary | ICD-10-CM | POA: Diagnosis not present

## 2023-07-18 DIAGNOSIS — F0153 Vascular dementia, unspecified severity, with mood disturbance: Secondary | ICD-10-CM | POA: Diagnosis not present

## 2023-07-18 DIAGNOSIS — F330 Major depressive disorder, recurrent, mild: Secondary | ICD-10-CM | POA: Diagnosis not present

## 2023-07-18 DIAGNOSIS — I10 Essential (primary) hypertension: Secondary | ICD-10-CM | POA: Diagnosis not present

## 2023-07-18 DIAGNOSIS — G4700 Insomnia, unspecified: Secondary | ICD-10-CM | POA: Diagnosis not present

## 2023-07-20 DIAGNOSIS — F0153 Vascular dementia, unspecified severity, with mood disturbance: Secondary | ICD-10-CM | POA: Diagnosis not present

## 2023-07-20 DIAGNOSIS — G4700 Insomnia, unspecified: Secondary | ICD-10-CM | POA: Diagnosis not present

## 2023-07-20 DIAGNOSIS — G40909 Epilepsy, unspecified, not intractable, without status epilepticus: Secondary | ICD-10-CM | POA: Diagnosis not present

## 2023-07-20 DIAGNOSIS — F01518 Vascular dementia, unspecified severity, with other behavioral disturbance: Secondary | ICD-10-CM | POA: Diagnosis not present

## 2023-07-20 DIAGNOSIS — F330 Major depressive disorder, recurrent, mild: Secondary | ICD-10-CM | POA: Diagnosis not present

## 2023-07-20 DIAGNOSIS — I10 Essential (primary) hypertension: Secondary | ICD-10-CM | POA: Diagnosis not present

## 2023-07-23 DIAGNOSIS — I10 Essential (primary) hypertension: Secondary | ICD-10-CM | POA: Diagnosis not present

## 2023-07-23 DIAGNOSIS — G4700 Insomnia, unspecified: Secondary | ICD-10-CM | POA: Diagnosis not present

## 2023-07-23 DIAGNOSIS — F01518 Vascular dementia, unspecified severity, with other behavioral disturbance: Secondary | ICD-10-CM | POA: Diagnosis not present

## 2023-07-23 DIAGNOSIS — G40909 Epilepsy, unspecified, not intractable, without status epilepticus: Secondary | ICD-10-CM | POA: Diagnosis not present

## 2023-07-23 DIAGNOSIS — F330 Major depressive disorder, recurrent, mild: Secondary | ICD-10-CM | POA: Diagnosis not present

## 2023-07-23 DIAGNOSIS — F0153 Vascular dementia, unspecified severity, with mood disturbance: Secondary | ICD-10-CM | POA: Diagnosis not present

## 2023-07-25 DIAGNOSIS — F0153 Vascular dementia, unspecified severity, with mood disturbance: Secondary | ICD-10-CM | POA: Diagnosis not present

## 2023-07-25 DIAGNOSIS — G4700 Insomnia, unspecified: Secondary | ICD-10-CM | POA: Diagnosis not present

## 2023-07-25 DIAGNOSIS — I10 Essential (primary) hypertension: Secondary | ICD-10-CM | POA: Diagnosis not present

## 2023-07-25 DIAGNOSIS — F330 Major depressive disorder, recurrent, mild: Secondary | ICD-10-CM | POA: Diagnosis not present

## 2023-07-25 DIAGNOSIS — F01518 Vascular dementia, unspecified severity, with other behavioral disturbance: Secondary | ICD-10-CM | POA: Diagnosis not present

## 2023-07-25 DIAGNOSIS — G40909 Epilepsy, unspecified, not intractable, without status epilepticus: Secondary | ICD-10-CM | POA: Diagnosis not present

## 2023-07-26 DIAGNOSIS — F01518 Vascular dementia, unspecified severity, with other behavioral disturbance: Secondary | ICD-10-CM | POA: Diagnosis not present

## 2023-07-26 DIAGNOSIS — G4700 Insomnia, unspecified: Secondary | ICD-10-CM | POA: Diagnosis not present

## 2023-07-26 DIAGNOSIS — F330 Major depressive disorder, recurrent, mild: Secondary | ICD-10-CM | POA: Diagnosis not present

## 2023-07-26 DIAGNOSIS — F0153 Vascular dementia, unspecified severity, with mood disturbance: Secondary | ICD-10-CM | POA: Diagnosis not present

## 2023-07-26 DIAGNOSIS — G40909 Epilepsy, unspecified, not intractable, without status epilepticus: Secondary | ICD-10-CM | POA: Diagnosis not present

## 2023-07-26 DIAGNOSIS — I10 Essential (primary) hypertension: Secondary | ICD-10-CM | POA: Diagnosis not present

## 2023-08-08 DIAGNOSIS — R456 Violent behavior: Secondary | ICD-10-CM | POA: Diagnosis not present

## 2023-08-08 DIAGNOSIS — Z7902 Long term (current) use of antithrombotics/antiplatelets: Secondary | ICD-10-CM | POA: Diagnosis not present

## 2023-08-08 DIAGNOSIS — Z043 Encounter for examination and observation following other accident: Secondary | ICD-10-CM | POA: Diagnosis not present

## 2023-08-08 DIAGNOSIS — Z79899 Other long term (current) drug therapy: Secondary | ICD-10-CM | POA: Diagnosis not present

## 2023-08-08 DIAGNOSIS — I10 Essential (primary) hypertension: Secondary | ICD-10-CM | POA: Diagnosis not present

## 2023-08-08 DIAGNOSIS — W19XXXA Unspecified fall, initial encounter: Secondary | ICD-10-CM | POA: Diagnosis not present

## 2023-08-08 DIAGNOSIS — S0093XA Contusion of unspecified part of head, initial encounter: Secondary | ICD-10-CM | POA: Diagnosis not present

## 2023-08-14 DIAGNOSIS — I10 Essential (primary) hypertension: Secondary | ICD-10-CM | POA: Diagnosis not present

## 2023-08-14 DIAGNOSIS — E785 Hyperlipidemia, unspecified: Secondary | ICD-10-CM | POA: Diagnosis not present

## 2023-08-14 DIAGNOSIS — F330 Major depressive disorder, recurrent, mild: Secondary | ICD-10-CM | POA: Diagnosis not present

## 2023-08-14 DIAGNOSIS — I679 Cerebrovascular disease, unspecified: Secondary | ICD-10-CM | POA: Diagnosis not present

## 2023-08-14 DIAGNOSIS — S0003XA Contusion of scalp, initial encounter: Secondary | ICD-10-CM | POA: Diagnosis not present

## 2023-08-14 DIAGNOSIS — F0153 Vascular dementia, unspecified severity, with mood disturbance: Secondary | ICD-10-CM | POA: Diagnosis not present

## 2023-08-14 DIAGNOSIS — R7309 Other abnormal glucose: Secondary | ICD-10-CM | POA: Diagnosis not present

## 2023-08-14 DIAGNOSIS — R262 Difficulty in walking, not elsewhere classified: Secondary | ICD-10-CM | POA: Diagnosis not present

## 2023-08-14 DIAGNOSIS — R053 Chronic cough: Secondary | ICD-10-CM | POA: Diagnosis not present

## 2023-08-14 DIAGNOSIS — R531 Weakness: Secondary | ICD-10-CM | POA: Diagnosis not present

## 2023-08-14 DIAGNOSIS — R569 Unspecified convulsions: Secondary | ICD-10-CM | POA: Diagnosis not present

## 2023-08-14 DIAGNOSIS — Z741 Need for assistance with personal care: Secondary | ICD-10-CM | POA: Diagnosis not present

## 2023-10-09 DIAGNOSIS — Z1231 Encounter for screening mammogram for malignant neoplasm of breast: Secondary | ICD-10-CM | POA: Diagnosis not present

## 2023-12-18 DIAGNOSIS — R569 Unspecified convulsions: Secondary | ICD-10-CM | POA: Diagnosis not present

## 2023-12-18 DIAGNOSIS — F330 Major depressive disorder, recurrent, mild: Secondary | ICD-10-CM | POA: Diagnosis not present

## 2023-12-18 DIAGNOSIS — F0153 Vascular dementia, unspecified severity, with mood disturbance: Secondary | ICD-10-CM | POA: Diagnosis not present

## 2023-12-18 DIAGNOSIS — I679 Cerebrovascular disease, unspecified: Secondary | ICD-10-CM | POA: Diagnosis not present

## 2023-12-18 DIAGNOSIS — I10 Essential (primary) hypertension: Secondary | ICD-10-CM | POA: Diagnosis not present

## 2023-12-18 DIAGNOSIS — R7309 Other abnormal glucose: Secondary | ICD-10-CM | POA: Diagnosis not present

## 2023-12-18 DIAGNOSIS — Z6828 Body mass index (BMI) 28.0-28.9, adult: Secondary | ICD-10-CM | POA: Diagnosis not present

## 2023-12-18 DIAGNOSIS — R531 Weakness: Secondary | ICD-10-CM | POA: Diagnosis not present

## 2023-12-18 DIAGNOSIS — E785 Hyperlipidemia, unspecified: Secondary | ICD-10-CM | POA: Diagnosis not present

## 2023-12-18 DIAGNOSIS — Z9181 History of falling: Secondary | ICD-10-CM | POA: Diagnosis not present

## 2023-12-18 DIAGNOSIS — R262 Difficulty in walking, not elsewhere classified: Secondary | ICD-10-CM | POA: Diagnosis not present
